# Patient Record
Sex: Female | Born: 1980 | Race: Black or African American | Hispanic: No | Marital: Single | Smoking: Never smoker
Health system: Southern US, Community
[De-identification: ages and names within clinical notes are randomized; demographics above are authoritative.]

## PROBLEM LIST (undated history)

## (undated) ENCOUNTER — Inpatient Hospital Stay (HOSPITAL_COMMUNITY): Payer: Self-pay

## (undated) ENCOUNTER — Emergency Department (HOSPITAL_COMMUNITY): Admission: EM | Discharge status: Absent without leave | Payer: Medicaid Other

## (undated) DIAGNOSIS — F32A Depression, unspecified: Secondary | ICD-10-CM

## (undated) DIAGNOSIS — Z8619 Personal history of other infectious and parasitic diseases: Secondary | ICD-10-CM

## (undated) DIAGNOSIS — F329 Major depressive disorder, single episode, unspecified: Secondary | ICD-10-CM

## (undated) HISTORY — DX: Depression, unspecified: F32.A

## (undated) HISTORY — DX: Major depressive disorder, single episode, unspecified: F32.9

## (undated) HISTORY — DX: Personal history of other infectious and parasitic diseases: Z86.19

---

## 2007-11-19 ENCOUNTER — Inpatient Hospital Stay (HOSPITAL_COMMUNITY): Admission: RE | Admit: 2007-11-19 | Discharge: 2007-11-21 | Payer: Self-pay | Admitting: Obstetrics and Gynecology

## 2008-08-19 ENCOUNTER — Emergency Department (HOSPITAL_COMMUNITY): Admission: EM | Admit: 2008-08-19 | Discharge: 2008-08-19 | Payer: Self-pay | Admitting: Emergency Medicine

## 2008-11-09 ENCOUNTER — Inpatient Hospital Stay (HOSPITAL_COMMUNITY): Admission: AD | Admit: 2008-11-09 | Discharge: 2008-11-09 | Payer: Self-pay | Admitting: Obstetrics and Gynecology

## 2008-11-29 ENCOUNTER — Ambulatory Visit (HOSPITAL_COMMUNITY): Admission: RE | Admit: 2008-11-29 | Discharge: 2008-11-29 | Payer: Self-pay | Admitting: Obstetrics and Gynecology

## 2008-12-19 ENCOUNTER — Inpatient Hospital Stay (HOSPITAL_COMMUNITY): Admission: RE | Admit: 2008-12-19 | Discharge: 2008-12-22 | Payer: Self-pay | Admitting: Obstetrics and Gynecology

## 2008-12-19 ENCOUNTER — Encounter (INDEPENDENT_AMBULATORY_CARE_PROVIDER_SITE_OTHER): Payer: Self-pay | Admitting: Obstetrics and Gynecology

## 2010-03-04 ENCOUNTER — Encounter: Payer: Self-pay | Admitting: Obstetrics and Gynecology

## 2010-05-16 LAB — CBC
HCT: 28.9 % — ABNORMAL LOW (ref 36.0–46.0)
Hemoglobin: 9.4 g/dL — ABNORMAL LOW (ref 12.0–15.0)
MCHC: 32.1 g/dL (ref 30.0–36.0)
MCHC: 32.5 g/dL (ref 30.0–36.0)
MCV: 77.4 fL — ABNORMAL LOW (ref 78.0–100.0)
MCV: 77.8 fL — ABNORMAL LOW (ref 78.0–100.0)
Platelets: 180 10*3/uL (ref 150–400)
RDW: 16.7 % — ABNORMAL HIGH (ref 11.5–15.5)
WBC: 8 10*3/uL (ref 4.0–10.5)

## 2010-05-16 LAB — RPR: RPR Ser Ql: NONREACTIVE

## 2010-05-18 LAB — URINALYSIS, ROUTINE W REFLEX MICROSCOPIC
Hgb urine dipstick: NEGATIVE
Nitrite: NEGATIVE
Protein, ur: NEGATIVE mg/dL
pH: 6.5 (ref 5.0–8.0)

## 2010-05-18 LAB — COMPREHENSIVE METABOLIC PANEL
AST: 17 U/L (ref 0–37)
Albumin: 3.2 g/dL — ABNORMAL LOW (ref 3.5–5.2)
BUN: 4 mg/dL — ABNORMAL LOW (ref 6–23)
CO2: 24 mEq/L (ref 19–32)
Calcium: 8.9 mg/dL (ref 8.4–10.5)
Chloride: 104 mEq/L (ref 96–112)
Creatinine, Ser: 0.7 mg/dL (ref 0.4–1.2)
GFR calc Af Amer: >60 mL/min (ref >60)
Potassium: 3.8 mEq/L (ref 3.5–5.1)
Total Bilirubin: 0.6 mg/dL (ref 0.3–1.2)
Total Protein: 6.7 g/dL (ref 6.0–8.3)

## 2010-05-18 LAB — CBC
Hemoglobin: 10.1 g/dL — ABNORMAL LOW (ref 12.0–15.0)
MCHC: 32.6 g/dL (ref 30.0–36.0)
WBC: 8.3 10*3/uL (ref 4.0–10.5)

## 2010-05-18 LAB — URINE CULTURE: Culture: NO GROWTH

## 2010-05-18 LAB — URINE MICROSCOPIC-ADD ON

## 2010-05-18 LAB — AMYLASE: Amylase: 101 U/L (ref 27–131)

## 2010-05-18 LAB — LIPASE, BLOOD: Lipase: 23 U/L (ref 11–59)

## 2010-06-26 NOTE — Op Note (Signed)
NAMEWERONIKA, BIRCH              ACCOUNT NO.:  [id number]   MEDICAL RECORD NO.:  [id number]          PATIENT TYPE:  INP   LOCATION:  9103                          FACILITY:  [facility]   PHYSICIAN:  Zenaida Niece, M.D.DATE OF BIRTH:  04/24/1980   DATE OF PROCEDURE:  11/19/2007  DATE OF DISCHARGE:                               OPERATIVE REPORT   PREOPERATIVE DIAGNOSES:  1. Intrauterine pregnancy at 39 weeks.  2. Previous cesarean section.   POSTOPERATIVE DIAGNOSES:  1. Intrauterine pregnancy at 39 weeks.  2. Previous cesarean section.   PROCEDURE:  Repeat low-transverse cesarean section.   SURGEON:  Zenaida Niece, MD   ASSISTANT:  Huel Cote, MD   ANESTHESIA:  Spinal.   FINDINGS:  She had normal gravid anatomy with a fairly thin lower  uterine segment.  She delivered a viable female infant with Apgars of 9  and 9 and weight 7 pounds 13 ounces.   SPECIMENS:  Placenta sent for cord blood donation and then delivered in  Delivery.   ESTIMATED BLOOD LOSS:  800 mL.   COMPLICATIONS:  None.   PROCEDURE IN DETAIL:  The patient was taken to the operating room and  placed in the sitting position.  Dr. Malen Gauze instilled spinal anesthesia.  She was then placed in the dorsal supine position with a left lateral  tilt.  Abdomen was then prepped and draped in the usual sterile fashion  and a Foley catheter was inserted.  The level of her anesthesia was  found to be adequate.  Abdomen was entered via a standard Pfannenstiel  incision through her previous scar.  Once the uterus was exposed, the  Alexis disposable self-retaining retractor was placed and the lower  uterine segment was well exposed.  A 4-cm transverse incision was then  made in the lower uterine segment pushing the bladder inferior.  Once  the uterine cavity was entered, the incision was extended digitally.  Membranes were ruptured revealing clear fluid.  The fetal vertex was  grasped and brought to the incision.  It  was then delivered  atraumatically.  Mouth and nares were suctioned and a loose nuchal cord  x1 was reduced.  The remainder of the infant then delivered  atraumatically.  Cord was doubly clamped and cut and the infant was  handed to the awaiting pediatric team.  Placenta was then delivered  about half spontaneously and a half manually and was sent for cord blood  donation.  Uterus was wiped dry with a clean lap pad and all clots and  debris removed.  Uterine incision was inspected and found to be free of  extensions.  Uterine incision was then first repaired in 1 layer with a  running-locking layer with #1 chromic.  The inferior portion of the  lower uterine segment was fairly thin and had several holes in it.  These holes were repaired with interrupted figure-of-eight sutures of #1  chromic with adequate closure and adequate hemostasis.  Tubes and  ovaries were inspected and found to be normal.  Uterine incision was  found to be hemostatic.  The Alexis retractor was  removed.  Subfascial  space was irrigated and made hemostatic with electrocautery.  Fascia was  then closed in a running fashion starting at both ends and meeting in  the middle with 0-Vicryl.  Subcutaneous tissue was then irrigated and  made hemostatic with electrocautery.  Skin was then closed with staples  followed by a sterile dressing.  The patient tolerated the procedure  well and was taken to the recovery room in stable condition.  Counts  were correct x2.  She received Ancef 1 g IV at the beginning of the  procedure and had PAS hose on throughout the procedure.      Zenaida Niece, M.D.  Electronically Signed     TDM/MEDQ  D:  11/19/2007  T:  11/20/2007  Job:  [id number]

## 2010-06-26 NOTE — Discharge Summary (Signed)
Anna James, Anna James              ACCOUNT NO.:  [id number]   MEDICAL RECORD NO.:  [id number]          PATIENT TYPE:  INP   LOCATION:  9103                          [facility]ILITY:  [facility]   PHYSICIAN:  Huel Cote, M.D. DATE OF BIRTH:  09-Aug-1980   DATE OF ADMISSION:  11/19/2007  DATE OF DISCHARGE:  11/21/2007                               DISCHARGE SUMMARY   DISCHARGE DIAGNOSES:  1. Term pregnancy at 39 weeks, delivered.  2. Status post repeat low-transverse cesarean section.   DISCHARGE MEDICATIONS:  1. Motrin 600 mg p.o. every 6 hours.  2. Percocet 1-2 tablets p.o. every 4 hours p.r.n.   Discharge hemoglobin 8.4.   DISCHARGE FOLLOWUP:  The patient is to follow up in the office on Monday  or Tuesday for staple removal and again in 2 weeks for an incision  check.   HOSPITAL COURSE:  The patient is a 30 year old female who underwent a  repeat low-transverse C-section on 11/19/2007.  For her complete history  and physical, please see the previously dictated report.  She underwent  her C-section without difficulty and was delivered of a viable female  infant, Apgars were 9 and 9, weight was 7 pounds 13 ounces.  She had a  normal anatomy noted at the time of C-section except for a thin lower  uterine segment was identified.  Estimated blood loss was 800 mL and she  was admitted for routine postoperative care.  Postop day #1, she was  ambulating and doing quite well.  Her hemoglobin as stated was 8.4,  which was down from just 9.9, and she continued to progress well.  By  postop day #2, she requested early discharge.  She was ambulating  without difficulty, eating, and voiding without problem, and was felt  stable for discharge home.      Huel Cote, M.D.  Electronically Signed     KR/MEDQ  D:  11/21/2007  T:  11/21/2007  Job:  [id number]

## 2010-06-26 NOTE — H&P (Signed)
Anna James, Anna James              ACCOUNT NO.:  [id number]   MEDICAL RECORD NO.:  [id number]          PATIENT TYPE:  INP   LOCATION:  NA                            [facility]ILITY[facility]  [facility]   PHYSICIAN:  Zenaida Niece, M.D.DATE OF BIRTH:  Sep 25, 1980   DATE OF ADMISSION:  DATE OF DISCHARGE:                              HISTORY & PHYSICAL   CHIEF COMPLAINT:  Repeat cesarean section.   HISTORY OF PRESENT ILLNESS:  This is a 30 year old female gravida 2,  para 1-0-0-1 with an EGA of [redacted] weeks by a 19-week ultrasound with a due  date of October 15 who is being admitted for repeat cesarean section.  The patient has had one prior low transverse cesarean section.  However,  she initially decided she wanted a VBAC, but has now decided she wants  to go ahead and proceed with a C-section.  We have attempted to get a  copy and have not been able to get a copy of her operative note from the  cesarean section.  She is being admitted for repeat cesarean section at  this time.  Prenatal care has been otherwise uncomplicated.   PRENATAL LABS:  Blood type is O positive with a negative antibody  screen.  RPR nonreactive, hepatitis B surface antigen negative, rubella  immune, HIV negative, gonorrhea and chlamydia negative, 1-hour Glucola  106.  Group B strep is negative.  Quad screen is normal.   PAST OB HISTORY:  In 2008 she had a low transverse cesarean section for  arrest of dilation.  This was done at 41 weeks.  Baby weighed 9 pounds 8  ounces.  There were no apparent complications.   PAST MEDICAL HISTORY:  Otherwise negative.   PAST SURGICAL HISTORY:  Significant only for the cesarean section.   ALLERGIES:  None known.   CURRENT MEDICATIONS:  None.   REVIEW OF SYSTEMS:  Normal complaints of pregnancy.   FAMILY HISTORY:  Noncontributory.   PHYSICAL EXAM:  CONSTITUTIONAL:  This is a well-developed gravid female  in no acute distress.  VITAL SIGNS:  Weight is 190 pounds, blood pressure is  110/60.  NECK:  Supple without lymphadenopathy or thyromegaly.  The thyroid is  slightly full.  HEART:  Regular rate and rhythm without murmur.  LUNGS:  Clear to auscultation.  ABDOMEN:  Gravid, nontender with a fundal height of 39 cm and well-  healed transverse scar.  EXTREMITIES:  Have trace edema and are nontender.  GENITOURINARY:  Cervix is 1 thick -3 vertex presentation.   ASSESSMENT:  1. Intrauterine pregnancy at 39 weeks.  2. Previous cesarean section.   The patient initially desired a VBAC.  However, she has now changed her  mind and wishes to have a repeat cesarean section.  I have not disagreed  with this since we cannot get a copy of her op note.  All risks of  surgery have been discussed and she understands.   PLAN:  To admit the patient on the day of surgery for repeat cesarean  section.      Zenaida Niece, M.D.  Electronically Signed  TDM/MEDQ  D:  11/18/2007  T:  11/18/2007  Job:  [id number]

## 2010-11-13 LAB — CBC
HCT: 25.5 — ABNORMAL LOW
HCT: 30.6 — ABNORMAL LOW
Hemoglobin: 8.4 — ABNORMAL LOW
Hemoglobin: 9.9 — ABNORMAL LOW
MCHC: 32.3
MCV: 76.6 — ABNORMAL LOW
Platelets: 172
RBC: 3.31 — ABNORMAL LOW
RDW: 16.7 — ABNORMAL HIGH
RDW: 17 — ABNORMAL HIGH
WBC: 8.1

## 2010-11-13 LAB — CCBB MATERNAL DONOR DRAW

## 2010-11-13 LAB — RPR: RPR Ser Ql: NONREACTIVE

## 2014-02-02 ENCOUNTER — Other Ambulatory Visit: Payer: Self-pay | Admitting: Obstetrics and Gynecology

## 2014-02-08 MED ORDER — CEFTRIAXONE SODIUM 250 MG IJ SOLR: 250.0000 mg | Freq: Once | INTRAMUSCULAR | Status: DC

## 2014-02-08 NOTE — Addendum Note (Signed)
Addended by: Rodell PernaHARRIS, Aribella Vavra M on: 02/08/2014 05:09 PM   Modules accepted: Orders

## 2014-09-19 ENCOUNTER — Inpatient Hospital Stay (HOSPITAL_COMMUNITY)
Admission: AD | Admit: 2014-09-19 | Discharge: 2014-09-19 | Disposition: A | Discharge status: Home / Self Care | Payer: Medicaid Other | Source: Ambulatory Visit | Attending: Family Medicine | Admitting: Family Medicine

## 2014-09-19 ENCOUNTER — Encounter (HOSPITAL_COMMUNITY): Payer: Self-pay

## 2014-09-19 ENCOUNTER — Inpatient Hospital Stay (HOSPITAL_COMMUNITY): Payer: Medicaid Other

## 2014-09-19 DIAGNOSIS — O9989 Other specified diseases and conditions complicating pregnancy, childbirth and the puerperium: Secondary | ICD-10-CM | POA: Diagnosis not present

## 2014-09-19 DIAGNOSIS — Z3A01 Less than 8 weeks gestation of pregnancy: Secondary | ICD-10-CM | POA: Diagnosis not present

## 2014-09-19 DIAGNOSIS — R1032 Left lower quadrant pain: Secondary | ICD-10-CM | POA: Diagnosis present

## 2014-09-19 DIAGNOSIS — Z3A08 8 weeks gestation of pregnancy: Secondary | ICD-10-CM | POA: Diagnosis not present

## 2014-09-19 DIAGNOSIS — R109 Unspecified abdominal pain: Secondary | ICD-10-CM | POA: Diagnosis not present

## 2014-09-19 DIAGNOSIS — Z3491 Encounter for supervision of normal pregnancy, unspecified, first trimester: Secondary | ICD-10-CM

## 2014-09-19 DIAGNOSIS — O26899 Other specified pregnancy related conditions, unspecified trimester: Secondary | ICD-10-CM

## 2014-09-19 LAB — CBC
HCT: 37.8 % (ref 36.0–46.0)
HEMOGLOBIN: 12.5 g/dL (ref 12.0–15.0)
MCH: 27.4 pg (ref 26.0–34.0)
MCHC: 33.1 g/dL (ref 30.0–36.0)
MCV: 82.9 fL (ref 78.0–100.0)
Platelets: 203 10*3/uL (ref 150–400)
RBC: 4.56 MIL/uL (ref 3.87–5.11)
RDW: 13.7 % (ref 11.5–15.5)
WBC: 6.9 10*3/uL (ref 4.0–10.5)

## 2014-09-19 LAB — WET PREP, GENITAL
WBC WET PREP: NONE SEEN
Yeast Wet Prep HPF POC: NONE SEEN

## 2014-09-19 LAB — ABO/RH: ABO/RH(D): O POS

## 2014-09-19 LAB — URINALYSIS, ROUTINE W REFLEX MICROSCOPIC
BILIRUBIN URINE: NEGATIVE
GLUCOSE, UA: NEGATIVE mg/dL
Hgb urine dipstick: NEGATIVE
Ketones, ur: NEGATIVE mg/dL
Leukocytes, UA: NEGATIVE
NITRITE: NEGATIVE
Protein, ur: NEGATIVE mg/dL
UROBILINOGEN UA: 0.2 mg/dL (ref 0.0–1.0)
pH: 5.5 (ref 5.0–8.0)

## 2014-09-19 LAB — POCT PREGNANCY, URINE: Preg Test, Ur: POSITIVE — AB

## 2014-09-19 LAB — HCG, QUANTITATIVE, PREGNANCY: HCG, BETA CHAIN, QUANT, S: 14723 m[IU]/mL — AB (ref <5)

## 2014-09-19 NOTE — Discharge Instructions (Signed)

## 2014-09-19 NOTE — MAU Provider Note (Signed)
Chief Complaint: Pregnancy    First Provider Initiated Contact with Patient 09/19/14 1119      SUBJECTIVE HPI: Anna James is a 34 y.o. G4P2003 at [redacted]w[redacted]d by LMP who presents to maternity admissions reporting positive pregnancy test at home, unsure LMP, and cramping abdominal pain, intermittently and on the left side.  She denies vaginal bleeding, vaginal itching/burning, urinary symptoms, h/a, dizziness, n/v, or fever/chills.     Abdominal Pain This is a new problem. The current episode started in the past 7 days. The onset quality is gradual. The problem occurs intermittently. The problem has been waxing and waning. The pain is located in the LLQ. The pain is mild. The quality of the pain is cramping. The abdominal pain does not radiate. Pertinent negatives include no diarrhea, dysuria, fever, frequency, headaches, nausea or vomiting. The pain is aggravated by certain positions and movement. She has tried nothing for the symptoms.    History reviewed. No pertinent past medical history. Past Surgical History  Procedure Laterality Date  . Cesarean section     History   Social History  . Marital Status: Single    Spouse Name: N/A  . Number of Children: N/A  . Years of Education: N/A   Occupational History  . Not on file.   Social History Main Topics  . Smoking status: Never Smoker   . Smokeless tobacco: Not on file  . Alcohol Use: No  . Drug Use: No  . Sexual Activity: Yes    Birth Control/ Protection: None   Other Topics Concern  . Not on file   Social History Narrative  . No narrative on file   No current facility-administered medications on file prior to encounter.   No current outpatient prescriptions on file prior to encounter.   No Known Allergies  Review of Systems  Constitutional: Negative for fever, chills and malaise/fatigue.  Eyes: Negative for blurred vision.  Respiratory: Negative for cough and shortness of breath.   Cardiovascular: Negative for  chest pain.  Gastrointestinal: Positive for abdominal pain. Negative for heartburn, nausea, vomiting and diarrhea.  Genitourinary: Negative for dysuria, urgency and frequency.  Musculoskeletal: Negative.   Neurological: Negative for dizziness and headaches.  Psychiatric/Behavioral: Negative for depression.    OBJECTIVE Blood pressure 115/68, pulse 76, resp. rate 16, last menstrual period 07/20/2014. GENERAL: Well-developed, well-nourished female in no acute distress.  EYES: normal sclera/conjunctiva; no lid-lag HENT: Atraumatic, normocephalic HEART: normal rate RESP: normal effort ABDOMEN: Soft, non-tender MUSCULOSKELETAL: Normal ROM EXTREMITIES: Nontender, no edema NEURO/PSYCH: Alert and oriented, appropriate affect  PELVIC EXAM: Cervix pink, visually closed, without lesion, scant white creamy discharge, vaginal walls and external genitalia normal Bimanual exam: Cervix 0/long/high, firm, anterior, neg CMT, uterus nontender, nonenlarged, adnexa without tenderness, enlargement, or mass   LAB RESULTS Results for orders placed or performed during the hospital encounter of 09/19/14 (from the past 24 hour(s))  Urinalysis, Routine w reflex microscopic (not at [facility])     Status: Abnormal   Collection Time: 09/19/14 10:35 AM  Result Value Ref Range   Color, Urine YELLOW YELLOW   APPearance CLEAR CLEAR   Specific Gravity, Urine >1.030 (H) 1.005 - 1.030   pH 5.5 5.0 - 8.0   Glucose, UA NEGATIVE NEGATIVE mg/dL   Hgb urine dipstick NEGATIVE NEGATIVE   Bilirubin Urine NEGATIVE NEGATIVE   Ketones, ur NEGATIVE NEGATIVE mg/dL   Protein, ur NEGATIVE NEGATIVE mg/dL   Urobilinogen, UA 0.2 0.0 - 1.0 mg/dL   Nitrite NEGATIVE NEGATIVE   Leukocytes,  UA NEGATIVE NEGATIVE  Pregnancy, urine POC     Status: Abnormal   Collection Time: 09/19/14 10:48 AM  Result Value Ref Range   Preg Test, Ur POSITIVE (A) NEGATIVE  Wet prep, genital     Status: Abnormal   Collection Time: 09/19/14 11:04 AM  Result  Value Ref Range   Yeast Wet Prep HPF POC NONE SEEN NONE SEEN   Trich, Wet Prep FEW (A) NONE SEEN   Clue Cells Wet Prep HPF POC FEW (A) NONE SEEN   WBC, Wet Prep HPF POC NONE SEEN NONE SEEN  CBC     Status: None   Collection Time: 09/19/14 11:18 AM  Result Value Ref Range   WBC 6.9 4.0 - 10.5 K/uL   RBC 4.56 3.87 - 5.11 MIL/uL   Hemoglobin 12.5 12.0 - 15.0 g/dL   HCT 40.9 81.1 - 91.4 %   MCV 82.9 78.0 - 100.0 fL   MCH 27.4 26.0 - 34.0 pg   MCHC 33.1 30.0 - 36.0 g/dL   RDW 78.2 95.6 - 21.3 %   Platelets 203 150 - 400 K/uL  hCG, quantitative, pregnancy     Status: Abnormal   Collection Time: 09/19/14 11:18 AM  Result Value Ref Range   hCG, Beta Chain, Quant, S 14723 (H) <5 mIU/mL  ABO/Rh     Status: None (Preliminary result)   Collection Time: 09/19/14 11:18 AM  Result Value Ref Range   ABO/RH(D) O POS     --/--/O POS (08/08 1118)  IMAGING US Ob Comp Less 14 Wks  09/19/2014   CLINICAL DATA:  34 year old pregnant female presenting with left lower quadrant pain. Beta HCG 14,723.  LMP 07/20/2014, EDC by LMP: 04/26/2015, projecting to an expected gestational age of [redacted] weeks 5 days.  EXAM: OBSTETRIC <14 WK [place] AND TRANSVAGINAL OB US  TECHNIQUE: Both transabdominal and transvaginal ultrasound examinations were performed for complete evaluation of the gestation as well as the maternal uterus, adnexal regions, and pelvic cul-de-sac. Transvaginal technique was performed to assess early pregnancy.  COMPARISON:  No prior scans from this gestation.  FINDINGS: Intrauterine gestational sac: Visualized/normal in shape.  Yolk sac:  Present and normal.  Embryo:  Not visualized. No embryonic cardiac activity visualized.  MSD: 12 mm 6 w 0 d [place] EDC (using MSD): 05/15/2015  Maternal uterus/adnexae: No uterine fibroids. No perigestational bleed. Normal cervix. Nonvisualization of the right ovary. No abnormal right adnexal masses detected. The left ovary measures 3.1 by 3.1 cm and contains a thick-walled 3.0 x 2.4  cm cystic structure with no internal vascularity and with heterogeneous internal echoes, in keeping with a slightly involuted hemorrhagic corpus luteum. No additional left adnexal masses are demonstrated. Nonspecific small volume free fluid in the pelvic cul-de-sac and left adnexa.  IMPRESSION: 1. Single intrauterine gestational sac with yolk sac at 6 weeks 0 days by mean sac size, which is discordant with the expected gestational age of [redacted] weeks 5 days by provided menstrual dating. No embryo or embryonic cardiac activity detected. These are worrisome prognostic signs, although these findings are not diagnostic for pregnancy outcome, and the absence of an embryo could be due to early gestational age. As such, close clinical follow-up is advised with serial serum BhCG monitoring and follow-up obstetric scan in 14 days. 2. Slightly involuted hemorrhagic left ovarian corpus luteum with small volume free fluid in the pelvis.   Electronically Signed   By: Delbert Phenix M.D.   On: 09/19/2014 13:04   US Ob Transvaginal  09/19/2014   CLINICAL DATA:  34 year old pregnant female presenting with left lower quadrant pain. Beta HCG 14,723.  LMP 07/20/2014, EDC by LMP: 04/26/2015, projecting to an expected gestational age of [redacted] weeks 5 days.  EXAM: OBSTETRIC <14 WK [place] AND TRANSVAGINAL OB US  TECHNIQUE: Both transabdominal and transvaginal ultrasound examinations were performed for complete evaluation of the gestation as well as the maternal uterus, adnexal regions, and pelvic cul-de-sac. Transvaginal technique was performed to assess early pregnancy.  COMPARISON:  No prior scans from this gestation.  FINDINGS: Intrauterine gestational sac: Visualized/normal in shape.  Yolk sac:  Present and normal.  Embryo:  Not visualized. No embryonic cardiac activity visualized.  MSD: 12 mm 6 w 0 d [place] EDC (using MSD): 05/15/2015  Maternal uterus/adnexae: No uterine fibroids. No perigestational bleed. Normal cervix. Nonvisualization of the right  ovary. No abnormal right adnexal masses detected. The left ovary measures 3.1 by 3.1 cm and contains a thick-walled 3.0 x 2.4 cm cystic structure with no internal vascularity and with heterogeneous internal echoes, in keeping with a slightly involuted hemorrhagic corpus luteum. No additional left adnexal masses are demonstrated. Nonspecific small volume free fluid in the pelvic cul-de-sac and left adnexa.  IMPRESSION: 1. Single intrauterine gestational sac with yolk sac at 6 weeks 0 days by mean sac size, which is discordant with the expected gestational age of [redacted] weeks 5 days by provided menstrual dating. No embryo or embryonic cardiac activity detected. These are worrisome prognostic signs, although these findings are not diagnostic for pregnancy outcome, and the absence of an embryo could be due to early gestational age. As such, close clinical follow-up is advised with serial serum BhCG monitoring and follow-up obstetric scan in 14 days. 2. Slightly involuted hemorrhagic left ovarian corpus luteum with small volume free fluid in the pelvis.   Electronically Signed   By: Delbert Phenix M.D.   On: 09/19/2014 13:04    ASSESSMENT 1. Abdominal pain affecting pregnancy   2. Normal IUP (intrauterine pregnancy) on prenatal ultrasound, first trimester     PLAN Discharge home F/U with prenatal provider for continued evaluation of viability.  Ectopic pregnancy ruled out by visible yolk sac on today's [place].   Follow-up Information    Please follow up.   Why:  Start prenatal care as soon as possible      Follow up with [facility] [place] [facility] ADMISS[facility].   Why:  As needed for emergencies   Contact information:   [address] [id number] mc [place] [address] [phone]      Sharen Counter Certified Nurse-Midwife 09/19/2014  6:17 PM

## 2014-09-19 NOTE — MAU Note (Signed)
Pt notified of lab result and need for ultrasound. Pt verbalizes understanding

## 2014-09-19 NOTE — MAU Note (Signed)
Also feels pain in lower abdomen, moreso on left side that comes and goes.

## 2014-09-19 NOTE — MAU Note (Signed)
Pt states here for verification letter and wants u/s. LMP in beginning of June. No bleeding. Did note vaginal odor yesterday, however denies abnormal discharge.

## 2014-09-20 LAB — GC/CHLAMYDIA PROBE AMP (~~LOC~~) NOT AT ARMC
CHLAMYDIA, DNA PROBE: NEGATIVE
Neisseria Gonorrhea: NEGATIVE

## 2014-09-20 LAB — HIV ANTIBODY (ROUTINE TESTING W REFLEX): HIV Screen 4th Generation wRfx: NONREACTIVE

## 2014-10-11 LAB — OB RESULTS CONSOLE GC/CHLAMYDIA
Chlamydia: NEGATIVE
GC PROBE AMP, GENITAL: NEGATIVE

## 2014-10-11 LAB — OB RESULTS CONSOLE ABO/RH: RH TYPE: POSITIVE

## 2014-10-11 LAB — OB RESULTS CONSOLE RUBELLA ANTIBODY, IGM: Rubella: IMMUNE

## 2014-10-11 LAB — OB RESULTS CONSOLE HIV ANTIBODY (ROUTINE TESTING): HIV: NONREACTIVE

## 2014-10-11 LAB — OB RESULTS CONSOLE ANTIBODY SCREEN: Antibody Screen: NEGATIVE

## 2014-10-11 LAB — OB RESULTS CONSOLE RPR: RPR: NONREACTIVE

## 2014-10-11 LAB — OB RESULTS CONSOLE HEPATITIS B SURFACE ANTIGEN: Hepatitis B Surface Ag: NEGATIVE

## 2015-04-17 LAB — OB RESULTS CONSOLE GBS: GBS: NEGATIVE

## 2015-05-05 ENCOUNTER — Telehealth (HOSPITAL_COMMUNITY): Payer: Self-pay | Admitting: *Deleted

## 2015-05-05 ENCOUNTER — Encounter (HOSPITAL_COMMUNITY): Payer: Self-pay | Admitting: *Deleted

## 2015-05-05 NOTE — Telephone Encounter (Signed)
Preadmission screen  

## 2015-05-10 ENCOUNTER — Encounter (HOSPITAL_COMMUNITY)
Admission: RE | Admit: 2015-05-10 | Discharge: 2015-05-10 | Disposition: A | Discharge status: Home / Self Care | Payer: Medicaid Other | Source: Ambulatory Visit | Attending: Obstetrics and Gynecology | Admitting: Obstetrics and Gynecology

## 2015-05-10 LAB — CBC
HEMATOCRIT: 30.9 % — AB (ref 36.0–46.0)
HEMOGLOBIN: 10.2 g/dL — AB (ref 12.0–15.0)
MCH: 26.4 pg (ref 26.0–34.0)
MCHC: 33 g/dL (ref 30.0–36.0)
MCV: 80.1 fL (ref 78.0–100.0)
PLATELETS: 187 10*3/uL (ref 150–400)
RBC: 3.86 MIL/uL — ABNORMAL LOW (ref 3.87–5.11)
RDW: 15.1 % (ref 11.5–15.5)
WBC: 6.8 10*3/uL (ref 4.0–10.5)

## 2015-05-10 LAB — TYPE AND SCREEN
ABO/RH(D): O POS
Antibody Screen: NEGATIVE

## 2015-05-10 NOTE — H&P (Signed)
Anna James is a 35 y.o. female, G4 P3004, EGA [redacted] weeks with EDC 4-6 presenting for repeat c-section.  Previous c-section x 3, last one for twins, prenatal care uncomplicated.  Maternal Medical History:  Fetal activity: Perceived fetal activity is normal.    Prenatal complications: no prenatal complications   OB History    Gravida Para Term Preterm AB TAB SAB Ectopic Multiple Living   4 3 3      1 4      Past Medical History  Diagnosis Date  . Depression     PP with 2nd, zoloft with 3rd  . Hx of varicella    Past Surgical History  Procedure Laterality Date  . Cesarean section     Family History: family history is not on file. Social History:  reports that she has never smoked. She does not have any smokeless tobacco history on file. She reports that she does not drink alcohol or use illicit drugs.   Prenatal Transfer Tool  Maternal Diabetes: No Genetic Screening: Declined Maternal Ultrasounds/Referrals: Normal Fetal Ultrasounds or other Referrals:  None Maternal Substance Abuse:  No Significant Maternal Medications:  None Significant Maternal Lab Results:  Lab values include: Group B Strep negative Other Comments:  None  Review of Systems  Respiratory: Negative.   Cardiovascular: Negative.       Last menstrual period 07/20/2014. Maternal Exam:  Abdomen: Patient reports no abdominal tenderness. Surgical scars: low transverse.   Estimated fetal weight is 8 lbs.   Fetal presentation: vertex  Introitus: Normal vulva. Normal vagina.    Physical Exam  Constitutional: She appears well-developed and well-nourished.  Neck: Neck supple. No thyromegaly present.  Cardiovascular: Normal rate, regular rhythm and normal heart sounds.   No murmur heard. Respiratory: Effort normal and breath sounds normal. No respiratory distress. She has no wheezes.  GI: Soft.    Prenatal labs: ABO, Rh: O/Positive/-- (08/30 0000) Antibody: Negative (08/30 0000) Rubella:  Immune RPR:  Nonreactive (08/30 0000)  HBsAg: Negative (08/30 0000)  HIV: Non-reactive (08/30 0000)  GBS: Negative (03/06 0000)   Assessment/Plan: IUP at 39 weeks, previous c-section x3, for repeat c-section.  Procedure and risks have been discussed, will admit for repeat c-section.   Ludella Pranger D 05/10/2015, 3:58 PM

## 2015-05-10 NOTE — Anesthesia Preprocedure Evaluation (Signed)
Anesthesia Evaluation  Patient identified by MRN, date of birth, ID band Patient awake    Reviewed: Allergy & Precautions, NPO status , Patient's Chart, lab work & pertinent test results  History of Anesthesia Complications Negative for: history of anesthetic complications  Airway Mallampati: II  TM Distance: >3 FB Neck ROM: Full    Dental no notable dental hx. (+) Dental Advisory Given   Pulmonary neg pulmonary ROS,    Pulmonary exam normal breath sounds clear to auscultation       Cardiovascular negative cardio ROS Normal cardiovascular exam Rhythm:Regular Rate:Normal     Neuro/Psych PSYCHIATRIC DISORDERS Anxiety Depression negative neurological ROS     GI/Hepatic negative GI ROS, Neg liver ROS,   Endo/Other  obesity  Renal/GU negative Renal ROS  negative genitourinary   Musculoskeletal negative musculoskeletal ROS (+)   Abdominal   Peds negative pediatric ROS (+)  Hematology negative hematology ROS (+)   Anesthesia Other Findings   Reproductive/Obstetrics (+) Pregnancy                             Anesthesia Physical Anesthesia Plan  ASA: II  Anesthesia Plan: Spinal   Post-op Pain Management:    Induction:   Airway Management Planned:   Additional Equipment:   Intra-op Plan:   Post-operative Plan:   Informed Consent: I have reviewed the patients History and Physical, chart, labs and discussed the procedure including the risks, benefits and alternatives for the proposed anesthesia with the patient or authorized representative who has indicated his/her understanding and acceptance.   Dental advisory given  Plan Discussed with: CRNA  Anesthesia Plan Comments:         Anesthesia Quick Evaluation

## 2015-05-11 ENCOUNTER — Inpatient Hospital Stay (HOSPITAL_COMMUNITY): Payer: Medicaid Other | Admitting: Anesthesiology

## 2015-05-11 ENCOUNTER — Encounter (HOSPITAL_COMMUNITY): Payer: Self-pay | Admitting: Emergency Medicine

## 2015-05-11 ENCOUNTER — Encounter (HOSPITAL_COMMUNITY)
Admission: RE | Disposition: A | Discharge status: Home / Self Care | Payer: Self-pay | Source: Ambulatory Visit | Attending: Obstetrics and Gynecology

## 2015-05-11 ENCOUNTER — Inpatient Hospital Stay (HOSPITAL_COMMUNITY)
Admission: RE | Admit: 2015-05-11 | Discharge: 2015-05-14 | DRG: 766 | Disposition: A | Discharge status: Home / Self Care | Payer: Medicaid Other | Source: Ambulatory Visit | Attending: Obstetrics and Gynecology | Admitting: Obstetrics and Gynecology

## 2015-05-11 DIAGNOSIS — O34211 Maternal care for low transverse scar from previous cesarean delivery: Principal | ICD-10-CM | POA: Diagnosis present

## 2015-05-11 DIAGNOSIS — O99344 Other mental disorders complicating childbirth: Secondary | ICD-10-CM | POA: Diagnosis not present

## 2015-05-11 DIAGNOSIS — Z98891 History of uterine scar from previous surgery: Secondary | ICD-10-CM

## 2015-05-11 DIAGNOSIS — F329 Major depressive disorder, single episode, unspecified: Secondary | ICD-10-CM | POA: Diagnosis not present

## 2015-05-11 DIAGNOSIS — Z3A39 39 weeks gestation of pregnancy: Secondary | ICD-10-CM

## 2015-05-11 LAB — RPR: RPR Ser Ql: NONREACTIVE

## 2015-05-11 SURGERY — Surgical Case
Anesthesia: Spinal

## 2015-05-11 MED ORDER — KETOROLAC TROMETHAMINE 30 MG/ML IJ SOLN
INTRAMUSCULAR | Status: AC
  Filled 2015-05-11: qty 1

## 2015-05-11 MED ORDER — ONDANSETRON HCL 4 MG/2ML IJ SOLN
INTRAMUSCULAR | Status: DC | PRN
  Administered 2015-05-11: 4 mg via INTRAVENOUS

## 2015-05-11 MED ORDER — NALBUPHINE HCL 10 MG/ML IJ SOLN: 5.0000 mg | Freq: Once | INTRAMUSCULAR | Status: AC | PRN

## 2015-05-11 MED ORDER — FENTANYL CITRATE (PF) 100 MCG/2ML IJ SOLN
INTRAMUSCULAR | Status: AC
  Filled 2015-05-11: qty 2

## 2015-05-11 MED ORDER — PHENYLEPHRINE 8 MG IN D5W 100 ML (0.08MG/ML) PREMIX OPTIME
INJECTION | INTRAVENOUS | Status: DC | PRN
  Administered 2015-05-11: 60 ug/min via INTRAVENOUS

## 2015-05-11 MED ORDER — DIPHENHYDRAMINE HCL 25 MG PO CAPS
25.0000 mg | ORAL_CAPSULE | ORAL | Status: DC | PRN
  Administered 2015-05-11: 25 mg via ORAL
  Filled 2015-05-11: qty 1

## 2015-05-11 MED ORDER — SENNOSIDES-DOCUSATE SODIUM 8.6-50 MG PO TABS
2.0000 | ORAL_TABLET | ORAL | Status: DC
  Administered 2015-05-11 – 2015-05-13 (×3): 2 via ORAL
  Filled 2015-05-11 (×3): qty 2

## 2015-05-11 MED ORDER — NALOXONE HCL 2 MG/2ML IJ SOSY
1.0000 ug/kg/h | PREFILLED_SYRINGE | INTRAVENOUS | Status: DC | PRN
  Filled 2015-05-11: qty 2

## 2015-05-11 MED ORDER — MENTHOL 3 MG MT LOZG: 1.0000 | LOZENGE | OROMUCOSAL | Status: DC | PRN

## 2015-05-11 MED ORDER — NALBUPHINE HCL 10 MG/ML IJ SOLN
INTRAMUSCULAR | Status: AC
  Administered 2015-05-11: 5 mg via INTRAVENOUS
  Filled 2015-05-11: qty 1

## 2015-05-11 MED ORDER — NALBUPHINE HCL 10 MG/ML IJ SOLN
5.0000 mg | INTRAMUSCULAR | Status: DC | PRN
  Administered 2015-05-11: 5 mg via INTRAVENOUS
  Filled 2015-05-11: qty 1

## 2015-05-11 MED ORDER — OXYTOCIN 10 UNIT/ML IJ SOLN
40.0000 [IU] | INTRAMUSCULAR | Status: DC | PRN
  Administered 2015-05-11: 40 [IU] via INTRAVENOUS

## 2015-05-11 MED ORDER — PHENYLEPHRINE 8 MG IN D5W 100 ML (0.08MG/ML) PREMIX OPTIME
INJECTION | INTRAVENOUS | Status: AC
Start: 2015-05-11 — End: 2015-05-11
  Filled 2015-05-11: qty 100

## 2015-05-11 MED ORDER — MORPHINE SULFATE (PF) 0.5 MG/ML IJ SOLN
INTRAMUSCULAR | Status: DC | PRN
  Administered 2015-05-11: .2 mg via INTRATHECAL

## 2015-05-11 MED ORDER — DIPHENHYDRAMINE HCL 50 MG/ML IJ SOLN: 12.5000 mg | INTRAMUSCULAR | Status: DC | PRN

## 2015-05-11 MED ORDER — FENTANYL CITRATE (PF) 100 MCG/2ML IJ SOLN
INTRAMUSCULAR | Status: DC | PRN
  Administered 2015-05-11: 10 ug via INTRATHECAL
  Administered 2015-05-11: 90 ug via INTRAVENOUS

## 2015-05-11 MED ORDER — DIPHENHYDRAMINE HCL 25 MG PO CAPS
25.0000 mg | ORAL_CAPSULE | Freq: Four times a day (QID) | ORAL | Status: DC | PRN
  Filled 2015-05-11: qty 1

## 2015-05-11 MED ORDER — OXYCODONE HCL 5 MG PO TABS
5.0000 mg | ORAL_TABLET | ORAL | Status: DC | PRN
  Administered 2015-05-12: 5 mg via ORAL
  Filled 2015-05-11 (×2): qty 1

## 2015-05-11 MED ORDER — LANOLIN HYDROUS EX OINT: 1.0000 "application " | TOPICAL_OINTMENT | CUTANEOUS | Status: DC | PRN

## 2015-05-11 MED ORDER — MEPERIDINE HCL 25 MG/ML IJ SOLN: 6.2500 mg | INTRAMUSCULAR | Status: DC | PRN

## 2015-05-11 MED ORDER — PRENATAL MULTIVITAMIN CH
1.0000 | ORAL_TABLET | Freq: Every day | ORAL | Status: DC
  Administered 2015-05-12 – 2015-05-14 (×3): 1 via ORAL
  Filled 2015-05-11 (×3): qty 1

## 2015-05-11 MED ORDER — SCOPOLAMINE 1 MG/3DAYS TD PT72: 1.0000 | MEDICATED_PATCH | Freq: Once | TRANSDERMAL | Status: DC

## 2015-05-11 MED ORDER — BUPIVACAINE IN DEXTROSE 0.75-8.25 % IT SOLN
INTRATHECAL | Status: DC | PRN
  Administered 2015-05-11: 1.8 mL via INTRATHECAL

## 2015-05-11 MED ORDER — SCOPOLAMINE 1 MG/3DAYS TD PT72
MEDICATED_PATCH | TRANSDERMAL | Status: AC
  Administered 2015-05-11: 1.5 mg via TRANSDERMAL
  Filled 2015-05-11: qty 1

## 2015-05-11 MED ORDER — MAGNESIUM HYDROXIDE 400 MG/5ML PO SUSP: 30.0000 mL | ORAL | Status: DC | PRN

## 2015-05-11 MED ORDER — CEFAZOLIN SODIUM-DEXTROSE 2-4 GM/100ML-% IV SOLN
2.0000 g | INTRAVENOUS | Status: AC
  Administered 2015-05-11: 2 g via INTRAVENOUS
  Filled 2015-05-11: qty 100

## 2015-05-11 MED ORDER — KETOROLAC TROMETHAMINE 30 MG/ML IJ SOLN
30.0000 mg | Freq: Four times a day (QID) | INTRAMUSCULAR | Status: AC | PRN
  Administered 2015-05-11: 30 mg via INTRAMUSCULAR

## 2015-05-11 MED ORDER — ONDANSETRON HCL 4 MG/2ML IJ SOLN: 4.0000 mg | Freq: Once | INTRAMUSCULAR | Status: DC | PRN

## 2015-05-11 MED ORDER — SIMETHICONE 80 MG PO CHEW
80.0000 mg | CHEWABLE_TABLET | ORAL | Status: DC | PRN
  Administered 2015-05-12 – 2015-05-13 (×4): 80 mg via ORAL
  Filled 2015-05-11 (×4): qty 1

## 2015-05-11 MED ORDER — NALOXONE HCL 0.4 MG/ML IJ SOLN: 0.4000 mg | INTRAMUSCULAR | Status: DC | PRN

## 2015-05-11 MED ORDER — SODIUM CHLORIDE 0.9% FLUSH: 3.0000 mL | INTRAVENOUS | Status: DC | PRN

## 2015-05-11 MED ORDER — OXYCODONE HCL 5 MG PO TABS
10.0000 mg | ORAL_TABLET | ORAL | Status: DC | PRN
  Administered 2015-05-13 – 2015-05-14 (×9): 10 mg via ORAL
  Filled 2015-05-11 (×9): qty 2

## 2015-05-11 MED ORDER — TETANUS-DIPHTH-ACELL PERTUSSIS 5-2.5-18.5 LF-MCG/0.5 IM SUSP: 0.5000 mL | Freq: Once | INTRAMUSCULAR | Status: DC

## 2015-05-11 MED ORDER — ACETAMINOPHEN 500 MG PO TABS
1000.0000 mg | ORAL_TABLET | Freq: Four times a day (QID) | ORAL | Status: AC
  Administered 2015-05-12: 1000 mg via ORAL
  Filled 2015-05-11 (×2): qty 2

## 2015-05-11 MED ORDER — IBUPROFEN 600 MG PO TABS
600.0000 mg | ORAL_TABLET | Freq: Four times a day (QID) | ORAL | Status: DC
  Administered 2015-05-11 – 2015-05-14 (×13): 600 mg via ORAL
  Filled 2015-05-11 (×13): qty 1

## 2015-05-11 MED ORDER — ONDANSETRON HCL 4 MG/2ML IJ SOLN
INTRAMUSCULAR | Status: AC
Start: 2015-05-11 — End: 2015-05-11
  Filled 2015-05-11: qty 2

## 2015-05-11 MED ORDER — DIBUCAINE 1 % RE OINT
1.0000 | TOPICAL_OINTMENT | RECTAL | Status: DC | PRN
Start: 2015-05-11 — End: 2015-05-15

## 2015-05-11 MED ORDER — OXYTOCIN 10 UNIT/ML IJ SOLN: 2.5000 [IU]/h | INTRAVENOUS | Status: AC

## 2015-05-11 MED ORDER — LACTATED RINGERS IV SOLN
INTRAVENOUS | Status: DC
  Administered 2015-05-11: 16:00:00 via INTRAVENOUS

## 2015-05-11 MED ORDER — SODIUM CHLORIDE 0.9 % IR SOLN
Status: DC | PRN
  Administered 2015-05-11: 1000 mL

## 2015-05-11 MED ORDER — MEASLES, MUMPS & RUBELLA VAC ~~LOC~~ INJ: 0.5000 mL | INJECTION | Freq: Once | SUBCUTANEOUS | Status: DC

## 2015-05-11 MED ORDER — ZOLPIDEM TARTRATE 5 MG PO TABS: 5.0000 mg | ORAL_TABLET | Freq: Every evening | ORAL | Status: DC | PRN

## 2015-05-11 MED ORDER — ACETAMINOPHEN 325 MG PO TABS: 650.0000 mg | ORAL_TABLET | ORAL | Status: DC | PRN

## 2015-05-11 MED ORDER — SCOPOLAMINE 1 MG/3DAYS TD PT72
1.0000 | MEDICATED_PATCH | Freq: Once | TRANSDERMAL | Status: DC
  Administered 2015-05-11: 1.5 mg via TRANSDERMAL

## 2015-05-11 MED ORDER — CEFAZOLIN SODIUM-DEXTROSE 2-3 GM-% IV SOLR
INTRAVENOUS | Status: AC
Start: 2015-05-11 — End: 2015-05-11
  Filled 2015-05-11: qty 50

## 2015-05-11 MED ORDER — NALBUPHINE HCL 10 MG/ML IJ SOLN
5.0000 mg | Freq: Once | INTRAMUSCULAR | Status: AC | PRN
  Administered 2015-05-11: 5 mg via SUBCUTANEOUS

## 2015-05-11 MED ORDER — FENTANYL CITRATE (PF) 100 MCG/2ML IJ SOLN
25.0000 ug | INTRAMUSCULAR | Status: DC | PRN
  Administered 2015-05-11 (×2): 50 ug via INTRAVENOUS

## 2015-05-11 MED ORDER — MORPHINE SULFATE (PF) 0.5 MG/ML IJ SOLN
INTRAMUSCULAR | Status: AC
Start: 2015-05-11 — End: 2015-05-11
  Filled 2015-05-11: qty 10

## 2015-05-11 MED ORDER — LACTATED RINGERS IV SOLN
INTRAVENOUS | Status: DC
Start: 2015-05-11 — End: 2015-05-11
  Administered 2015-05-11: 07:00:00 via INTRAVENOUS

## 2015-05-11 MED ORDER — OXYTOCIN 10 UNIT/ML IJ SOLN
INTRAMUSCULAR | Status: AC
  Filled 2015-05-11: qty 4

## 2015-05-11 MED ORDER — ONDANSETRON HCL 4 MG/2ML IJ SOLN
4.0000 mg | Freq: Three times a day (TID) | INTRAMUSCULAR | Status: DC | PRN
Start: 2015-05-11 — End: 2015-05-15

## 2015-05-11 MED ORDER — NALBUPHINE HCL 10 MG/ML IJ SOLN
5.0000 mg | INTRAMUSCULAR | Status: DC | PRN
  Administered 2015-05-11: 5 mg via SUBCUTANEOUS
  Filled 2015-05-11: qty 1

## 2015-05-11 MED ORDER — WITCH HAZEL-GLYCERIN EX PADS
1.0000 | MEDICATED_PAD | CUTANEOUS | Status: DC | PRN
Start: 2015-05-11 — End: 2015-05-15

## 2015-05-11 MED ORDER — KETOROLAC TROMETHAMINE 30 MG/ML IJ SOLN: 30.0000 mg | Freq: Four times a day (QID) | INTRAMUSCULAR | Status: AC | PRN

## 2015-05-11 SURGICAL SUPPLY — 35 items
BENZOIN TINCTURE PRP APPL 2/3 (GAUZE/BANDAGES/DRESSINGS) ×3 IMPLANT
CHLORAPREP W/TINT 26ML (MISCELLANEOUS) ×3 IMPLANT
CLAMP CORD UMBIL (MISCELLANEOUS) IMPLANT
CLOSURE WOUND 1/2 X4 (GAUZE/BANDAGES/DRESSINGS) ×1
CLOTH BEACON ORANGE TIMEOUT ST (SAFETY) ×3 IMPLANT
CONTAINER PREFILL 10% NBF 15ML (MISCELLANEOUS) IMPLANT
DRSG OPSITE POSTOP 4X10 (GAUZE/BANDAGES/DRESSINGS) ×3 IMPLANT
ELECT REM PT RETURN 9FT ADLT (ELECTROSURGICAL) ×3
ELECTRODE REM PT RTRN 9FT ADLT (ELECTROSURGICAL) ×1 IMPLANT
EXTRACTOR VACUUM KIWI (MISCELLANEOUS) IMPLANT
EXTRACTOR VACUUM M CUP 4 TUBE (SUCTIONS) IMPLANT
EXTRACTOR VACUUM M CUP 4' TUBE (SUCTIONS)
GLOVE BIOGEL PI IND STRL 7.0 (GLOVE) ×1 IMPLANT
GLOVE BIOGEL PI INDICATOR 7.0 (GLOVE) ×2
GLOVE ORTHO TXT STRL SZ7.5 (GLOVE) ×3 IMPLANT
GOWN STRL REUS W/TWL LRG LVL3 (GOWN DISPOSABLE) ×9 IMPLANT
KIT ABG SYR 3ML LUER SLIP (SYRINGE) IMPLANT
NEEDLE HYPO 25X5/8 SAFETYGLIDE (NEEDLE) ×3 IMPLANT
NS IRRIG 1000ML POUR BTL (IV SOLUTION) ×3 IMPLANT
PACK C SECTION WH (CUSTOM PROCEDURE TRAY) ×3 IMPLANT
PAD OB MATERNITY 4.3X12.25 (PERSONAL CARE ITEMS) ×3 IMPLANT
PENCIL SMOKE EVAC W/HOLSTER (ELECTROSURGICAL) ×3 IMPLANT
RTRCTR C-SECT PINK 25CM LRG (MISCELLANEOUS) ×3 IMPLANT
STRIP CLOSURE SKIN 1/2X4 (GAUZE/BANDAGES/DRESSINGS) ×2 IMPLANT
SUT CHROMIC 1 CTX 36 (SUTURE) ×6 IMPLANT
SUT PLAIN 0 NONE (SUTURE) IMPLANT
SUT PLAIN 2 0 XLH (SUTURE) IMPLANT
SUT VIC AB 0 CT1 27 (SUTURE) ×4
SUT VIC AB 0 CT1 27XBRD ANBCTR (SUTURE) ×2 IMPLANT
SUT VIC AB 2-0 CT1 (SUTURE) ×3 IMPLANT
SUT VIC AB 2-0 CT1 27 (SUTURE) ×2
SUT VIC AB 2-0 CT1 TAPERPNT 27 (SUTURE) ×1 IMPLANT
SUT VIC AB 4-0 KS 27 (SUTURE) ×3 IMPLANT
TOWEL OR 17X24 6PK STRL BLUE (TOWEL DISPOSABLE) ×3 IMPLANT
TRAY FOLEY CATH SILVER 14FR (SET/KITS/TRAYS/PACK) ×3 IMPLANT

## 2015-05-11 NOTE — Anesthesia Procedure Notes (Signed)
Spinal Patient location during procedure: OR Staffing Anesthesiologist: Alyssa Rotondo Performed by: anesthesiologist  Preanesthetic Checklist Completed: patient identified, site marked, surgical consent, pre-op evaluation, timeout performed, IV checked, risks and benefits discussed and monitors and equipment checked Spinal Block Patient position: sitting Prep: DuraPrep Patient monitoring: continuous pulse ox, blood pressure and heart rate Approach: midline Location: L3-4 Injection technique: single-shot Needle Needle type: Sprotte  Needle gauge: 24 G Needle length: 9 cm Additional Notes Functioning IV was confirmed and monitors were applied. Sterile prep and drape, including hand hygiene, mask and sterile gloves were used. The patient was positioned and the spine was prepped. The skin was anesthetized with lidocaine.  Free flow of clear CSF was obtained prior to injecting local anesthetic into the CSF.  The spinal needle aspirated freely following injection.  The needle was carefully withdrawn.  The patient tolerated the procedure well. Consent was obtained prior to procedure with all questions answered and concerns addressed. Risks including but not limited to bleeding, infection, nerve damage, paralysis, failed block, inadequate analgesia, allergic reaction, high spinal, itching and headache were discussed and the patient wished to proceed.   Sha Amer, MD     

## 2015-05-11 NOTE — Anesthesia Postprocedure Evaluation (Signed)
Anesthesia Post Note  Patient: Leland JohnsShameela A Arkwright  Procedure(s) Performed: Procedure(s) (LRB): CESAREAN SECTION (N/A)  Patient location during evaluation: Mother Baby Anesthesia Type: Spinal Level of consciousness: awake and alert and oriented Pain management: satisfactory to patient Vital Signs Assessment: post-procedure vital signs reviewed and stable Respiratory status: spontaneous breathing and nonlabored ventilation Cardiovascular status: stable Postop Assessment: no headache, no backache, patient able to bend at knees, no signs of nausea or vomiting and adequate PO intake Anesthetic complications: no    Last Vitals:  Filed Vitals:   05/11/15 1435 05/11/15 1440  BP: 109/64 106/61  Pulse: 67 79  Temp:    Resp:      Last Pain:  Filed Vitals:   05/11/15 1608  PainSc: 4                  Nely Dedmon

## 2015-05-11 NOTE — Interval H&P Note (Signed)
History and Physical Interval Note:  05/11/2015 7:09 AM  Anna James  has presented today for surgery, with the diagnosis of Repeat C/Section  The various methods of treatment have been discussed with the patient and family. After consideration of risks, benefits and other options for treatment, the patient has consented to  Procedure(s): CESAREAN SECTION (N/A) as a surgical intervention .  The patient's history has been reviewed, patient examined, no change in status, stable for surgery.  I have reviewed the patient's chart and labs.  Questions were answered to the patient's satisfaction.     [name]

## 2015-05-11 NOTE — Anesthesia Postprocedure Evaluation (Signed)
Anesthesia Post Note  Patient: Anna James  Procedure(s) Performed: Procedure(s) (LRB): CESAREAN SECTION (N/A)  Patient location during evaluation: PACU Anesthesia Type: Spinal Level of consciousness: awake and alert Pain management: pain level controlled Vital Signs Assessment: post-procedure vital signs reviewed and stable Respiratory status: spontaneous breathing, nonlabored ventilation, respiratory function stable and patient connected to nasal cannula oxygen Cardiovascular status: blood pressure returned to baseline and stable Postop Assessment: no signs of nausea or vomiting, spinal receding and patient able to bend at knees Anesthetic complications: no    Last Vitals:  Filed Vitals:   05/11/15 1010 05/11/15 1100  BP:  90/47  Pulse: 52 87  Temp:    Resp: 21 20    Last Pain:  Filed Vitals:   05/11/15 1144  PainSc: Asleep                 Eleonora Peeler JENNETTE

## 2015-05-11 NOTE — Op Note (Signed)
Preoperative diagnosis: Intrauterine pregnancy at 39 weeks, previous c-section x 3 Postoperative diagnosis: Same Procedure: Repeat  low transverse cesarean section without extensions Surgeon: Lavina Hammanodd Shakerra Red M.D. Assistant:  Darlina SicilianHeather Kreitmeyer, RNFA Anesthesia: Spinal  Findings: Patient had normal gravid anatomy and delivered a viable female infant with Apgars of 9 and 9 weight pending Estimated blood loss: 800 cc Specimens: Placenta sent to labor and delivery Complications: None  Procedure in detail: The patient was taken to the operating room and placed in the sitting position. Dr. Gentry RochJudd instilled spinal anesthesia.  She was then placed in the dorsosupine position with left tilt. Abdomen was then prepped and draped in the usual sterile fashion, and a foley catheter was inserted. The level of her anesthesia was found to be adequate. Abdomen was entered via a standard Pfannenstiel incision. Through her previous scar, there were minimal adhesions. Once the peritoneal cavity was entered the Alexis disposable self-retaining retractor was placed and good visualization was achieved. A 4 cm transverse incision was then made in the lower uterine segment pushing the bladder inferior. Once the uterine cavity was entered the incision was extended digitally. The fetal vertex was grasped and delivered through the incision atraumatically. Mouth and nares were suctioned. The remainder of the infant then delivered atraumatically. Cord was doubly clamped and cut and the infant handed to the awaiting pediatric team. Cord blood was obtained. The placenta delivered spontaneously. Uterus was wiped dry with clean lap pad and all clots and debris were removed. Uterine incision was inspected and found to be free of extensions. Uterine incision was closed in 1 layer with running locking #1 Chromic. Tubes and ovaries were inspected and found to be normal. Uterine incision was inspected and found to be hemostatic. Bleeding from  serosal edges was controlled with electrocautery. The Alexis retractor was removed. Subfascial space was irrigated and made hemostatic with electrocautery. Peritoneum was closed with 2-0 Vicryl.  Fascia was closed in running fashion starting at both ends and meeting in the middle with 0 Vicryl. Subcutaneous tissue was then irrigated and made hemostatic with electrocautery. Skin was closed with running 4-0 Vicryl subcuticular suture followed by steri-strips and a sterile dressing. Patient tolerated the procedure well and was taken to the recovery in stable condition. Counts were correct x2, she received Ancef 2 g IV at the beginning of the procedure and she had PAS hose on throughout the procedure.

## 2015-05-11 NOTE — Progress Notes (Signed)
UR chart review completed.  

## 2015-05-11 NOTE — Addendum Note (Signed)
Addendum  created 05/11/15 1622 by Shanon PayorSuzanne M Kennedy Bohanon, CRNA   Modules edited: Clinical Notes   Clinical Notes:  File: [id number]

## 2015-05-11 NOTE — Transfer of Care (Signed)
Immediate Anesthesia Transfer of Care Note  Patient: Anna JohnsShameela A Pavlich  Procedure(s) Performed: Procedure(s): CESAREAN SECTION (N/A)  Patient Location: PACU  Anesthesia Type:Spinal  Level of Consciousness: awake, alert  and oriented  Airway & Oxygen Therapy: Patient Spontanous Breathing  Post-op Assessment: Report given to RN and Post -op Vital signs reviewed and stable  Post vital signs: Reviewed and stable  Last Vitals:  Filed Vitals:   05/11/15 1009 05/11/15 1010  BP:    Pulse: 77 52  Temp:    Resp: 18 21    Complications: No apparent anesthesia complications

## 2015-05-11 NOTE — Lactation Note (Signed)
This note was copied from a baby's chart. Lactation Consultation Note  Patient Name: Anna James ZOXWR'UToday's Date: 05/11/2015 Reason for consult: Initial assessment Baby at 9 hr of life and mom is worried about supply so she started formula. She stated with her 37 yr old she never made milk, with her 37 yr old her nipples had blisters, and she did not even try to bf the 37 yr old twins. Discussed baby behavior, feeding frequency, baby belly size, voids, wt loss, breast changes, and nipple care. Answered questions about pumping. She stated that she tried to manually express but did not see anything. Offered to help but she declined. Mom will offer both breast at least 10 minutes each on demand 8+/24 hr and only offer formula after bf per the guidelines if the baby still seems hungry. Given lactation handouts. Aware of OP services and support group.    Maternal Data Has patient been taught Hand Expression?: Yes Does the patient have breastfeeding experience prior to this delivery?: Yes  Feeding Feeding Type: Breast Fed Length of feed: 20 min  LATCH Score/Interventions Latch: Repeated attempts needed to sustain latch, nipple held in mouth throughout feeding, stimulation needed to elicit sucking reflex. Intervention(s): Adjust position;Assist with latch;Breast massage;Breast compression  Audible Swallowing: None Intervention(s): Skin to skin  Type of Nipple: Inverted Intervention(s): Reverse pressure Intervention(s): Reverse pressure  Comfort (Breast/Nipple): Soft / non-tender     Hold (Positioning): Assistance needed to correctly position infant at breast and maintain latch. Intervention(s): Breastfeeding basics reviewed;Support Pillows;Position options;Skin to skin  LATCH Score: 4  Lactation Tools Discussed/Used WIC Program: Yes   Consult Status Consult Status: Follow-up Date: 05/12/15 Follow-up type: In-patient    Rulon Eisenmengerlizabeth E Jenene Kauffmann 05/11/2015, 5:00 PM

## 2015-05-12 ENCOUNTER — Encounter (HOSPITAL_COMMUNITY): Payer: Self-pay | Admitting: Obstetrics and Gynecology

## 2015-05-12 LAB — CBC
HEMATOCRIT: 25.8 % — AB (ref 36.0–46.0)
Hemoglobin: 8.6 g/dL — ABNORMAL LOW (ref 12.0–15.0)
MCH: 26.6 pg (ref 26.0–34.0)
MCHC: 33.3 g/dL (ref 30.0–36.0)
MCV: 79.9 fL (ref 78.0–100.0)
Platelets: 158 10*3/uL (ref 150–400)
RBC: 3.23 MIL/uL — AB (ref 3.87–5.11)
RDW: 15 % (ref 11.5–15.5)
WBC: 7.4 10*3/uL (ref 4.0–10.5)

## 2015-05-12 LAB — BIRTH TISSUE RECOVERY COLLECTION (PLACENTA DONATION)

## 2015-05-12 NOTE — Progress Notes (Signed)
CSW made report to Child Protective Services based on information that MOB's 4 other children are in Foster Care.  CSW has not had an opportunity to meet with MOB at this point, but felt since it is late in the day on Friday, that CPS needed to be notified of baby's birth even prior to CSW's assessment.  CPS case was accepted with an immediate response time and assigned to L. McGee.  CSW expects this means a CPS worker will meet with MOB in the hospital today.  CSW notified Central Nursery Charge RN.  CSW will follow up tomorrow. 

## 2015-05-12 NOTE — Lactation Note (Signed)
This note was copied from a baby's chart. Lactation Consultation Note  Patient Name: Girl Bonnita NasutiShameela Duesing ZOXWR'UToday's Date: 05/12/2015 Reason for consult: Follow-up assessment   With this mom of a term baby, now 7631 hours old. Mom started breastfeeding, and now has been bottle/formula feeding. Mom reports baby prefers the breast. Mom allowed me to show her with hand expression that she has a good amount of colostrum, and I suggested that she go back to breast feeding, when the baby next shows cues. The mom wanted to start DEP, so I set up pump, mom began pumping, and had transitional milk flowing from left breast. The baby woke up soon after mom began pumping, so mom stopped pumping and the baby latched well to left breast, in football hold. I encouraged mom to continue breast feeding, told her she does not need to pump if baby is breastfeeding. Mom knows to call for questions/concerns.   Maternal Data    Feeding Feeding Type: Breast Fed  LATCH Score/Interventions Latch: Repeated attempts needed to sustain latch, nipple held in mouth throughout feeding, stimulation needed to elicit sucking reflex. Intervention(s): Adjust position;Assist with latch;Breast compression  Audible Swallowing: A few with stimulation  Type of Nipple: Everted at rest and after stimulation  Comfort (Breast/Nipple): Soft / non-tender     Hold (Positioning): Assistance needed to correctly position infant at breast and maintain latch. Intervention(s): Breastfeeding basics reviewed;Support Pillows;Position options;Skin to skin  LATCH Score: 7  Lactation Tools Discussed/Used Pump Review: Setup, frequency, and cleaning;Milk Storage;Other (comment) (hand expression  advised after pumping) Initiated by:: c Lacinda Curvin rn IBCLC Date initiated:: 05/13/15   Consult Status Consult Status: Follow-up Date: 05/13/15 Follow-up type: In-patient    Alfred LevinsLee, Dilyn Osoria Anne 05/12/2015, 3:28 PM

## 2015-05-12 NOTE — Progress Notes (Signed)
Subjective: Postpartum Day #1: Cesarean Delivery Patient reports incisional pain, tolerating PO and no problems voiding.    Objective: Vital signs in last 24 hours: Temp:  [97.7 F (36.5 C)-98.8 F (37.1 C)] 97.7 F (36.5 C) (03/31 0800) Pulse Rate:  [52-116] 64 (03/31 0800) Resp:  [12-35] 16 (03/31 0800) BP: (90-126)/(47-84) 99/74 mmHg (03/31 0800) SpO2:  [88 %-100 %] 100 % (03/31 0800)  Physical Exam:  General: alert Lochia: appropriate Uterine Fundus: firm Incision: dressing C/D/I    Recent Labs  05/10/15 1630 05/12/15 0610  HGB 10.2* 8.6*  HCT 30.9* 25.8*    Assessment/Plan: Status post Cesarean section. Doing well postoperatively.  Continue current care, ambulate.  Aryav Wimberly D 05/12/2015, 8:15 AM

## 2015-05-13 NOTE — Clinical Social Work Maternal (Signed)
CLINICAL SOCIAL WORK MATERNAL/CHILD NOTE  Patient Details  Name: Anna James MRN: [id number] Date of Birth: 09/21/1980  Date:  05/13/2015  Clinical Social Worker Initiating Note:  Kolston Lacount E. Lesslie Mossa, LCSW Date/ Time Initiated:  05/13/15/1115     Child's Name:  Anna James   Legal Guardian:  Mother (Parents: Jadi Heyward and Abdul James)   Need for Interpreter:  None   Date of Referral:  05/12/15     Reason for Referral:  Other (Comment) (Hx of PPD)   Referral Source:  Central Nursery   Address:  [address] [place], [place] [address]  Phone number:  [phone]   Household Members:  Significant Other   Natural Supports (not living in the home):  Immediate Family, Church, Extended Family (MOB reports that she "stays to myself," but has good support people in her life.)   Professional Supports: Other (Comment) (MOB reports that she has a psychiatrist at [facility].)   Employment:     Type of Work:     Education:      Financial Resources:  Medicaid   Other Resources:  WIC, Food Stamps    Cultural/Religious Considerations Which May Impact Care: None stated.  MOB reports great support from her church family.  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  MOB's 4 other children are in DHHS custody and live with MGM.)   Cognitive State:  Able to Concentrate , Alert , Goal Oriented , Linear Thinking    Mood/Affect:  Interested , Calm , Relaxed    CSW Assessment: CSW met with MOB in her first floor room/127 to complete assessment due to hx of PPD.  MOB was changing babies diaper and appears comfortable with caring for infant.  She was pleasant and welcoming of CSW's visit, although appears tired. MOB reports she and baby are doing well.  She states FOB is involved and supportive, but currently "back home in [place]" for his  father's funeral.  She states he currently flying back to [place].  MOB reports having everything needed for baby at home and is aware of SIDS precautions, which CSW reviewed.  She states transportation can be an issue for her at times, but that currently she has been using her boyfriends car until they can get her's fixed.  She states she has utilized Medicaid transportation when needed.  MOB reports that this baby is the first for her and FOB together, but that he has two children who live with their mother and that she has 4 children who live with MGM.   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  CSW explained that mental health symptoms cannot necessarily be willed away, and asked her about her coping mechanisms to deal with mental health symptoms.  MOB states she plans to stay active and exercise.  She already notes more energy with this pregnancy than her others and notes this is a good sign.  She states she makes goals and accomplishing them keeps her on track.  She also states she plans to resume Zoloft, which she finds beneficial to her.  She states she has a doctor at [facility] and asked to be restarted about a week ago,   but was told they would not restart this medication until after she delivered.  She states she plans to go to [facility] on Monday and will ask to be restarted at that time.  She reports no need for assistance with this.  CSW commends her for her awareness of mental health concerns and for taking necessary steps for treatment.  MOB reports that she does not have a counselor at this time and is not interested in one.  CSW asked that she continue to monitor her feelings and inform her doctor of any concerns.  MOB agreed.  CSW also informed MOB of the Feelings After Birth support group offered at [facility]. CSW informed MOB of notification by CPS worker that  MGM must be present at discharge.  CSW did not ask MOB to talk about her CPS history, but brought this up to ensure that she is aware that her mother must be here at discharge.  MOB states knowledge of this and reports that her mother will be staying with her from discharge until planned meeting on Monday.  MOB appears calm about this situation.  CSW notified Pediatrician of discharge plan.  CSW Plan/Description:  Patient/Family Education , No Further Intervention Required/No Barriers to Discharge, Child Protective Service Report     Murel Wigle Elizabeth, LCSW 05/13/2015, 1:58 PM  

## 2015-05-13 NOTE — Progress Notes (Signed)
POD #2 Sore, no problems Afeb, VSS Abd- soft, fundus firm, incision intact Continue current care

## 2015-05-13 NOTE — Progress Notes (Signed)
CSW spoke with L. McGee/CPS who states she has completed a Safety Plan with MOB and that baby can discharge to MOB's care with supervision from MGM/Brenda Bradley.  Ms. Bradley must be present at baby's discharge. 

## 2015-05-14 MED ORDER — OXYCODONE HCL 5 MG PO TABS: 5.0000 mg | ORAL_TABLET | ORAL | Status: DC | PRN

## 2015-05-14 MED ORDER — IBUPROFEN 600 MG PO TABS: 600.0000 mg | ORAL_TABLET | Freq: Four times a day (QID) | ORAL | Status: DC

## 2015-05-14 NOTE — Discharge Summary (Signed)
OB Discharge Summary     Patient Name: Anna James DOB: 05-15-1980 MRN: [id number]  Date of admission: 05/11/2015 Delivering MD: Jackelyn KnifeMEISINGER, Breyton Vanscyoc   Date of discharge: 05/14/2015  Admitting diagnosis: Repeat C-Section Intrauterine pregnancy: 234w3d     Secondary diagnosis:  Active Problems:   S/P cesarean section      Discharge diagnosis: Term Pregnancy Delivered                                                                                                Complications: None  Hospital course:  Sceduled C/S   35 y.o. yo G4P4005 at 5534w3d was admitted to the hospital 05/11/2015 for scheduled cesarean section with the following indication:Elective Repeat.  Membrane Rupture Time/Date: 7:44 AM ,05/11/2015   Patient delivered a Viable infant.05/11/2015  Details of operation can be found in separate operative note.  Pateint had an uncomplicated postpartum course.  She is ambulating, tolerating a regular diet, passing flatus, and urinating well. Patient is discharged home in stable condition on  05/14/2015          Physical exam  Filed Vitals:   05/12/15 1726 05/13/15 0520 05/13/15 1837 05/14/15 0714  BP: 126/56 127/61 113/59 113/67  Pulse: 108 72 87 91  Temp: 97.9 F (36.6 C) 97.9 F (36.6 C) 98.3 F (36.8 C) 98.2 F (36.8 C)  TempSrc: Oral  Oral Oral  Resp: 18 18 18 18   SpO2:   98%    General: alert Lochia: appropriate Uterine Fundus: firm Incision: Healing well with no significant drainage  Labs: Lab Results  Component Value Date   WBC 7.4 05/12/2015   HGB 8.6* 05/12/2015   HCT 25.8* 05/12/2015   MCV 79.9 05/12/2015   PLT 158 05/12/2015   CMP Latest Ref Rng 11/09/2008  Glucose 70 - 99 mg/dL 88  BUN 6 - 23 mg/dL 4(L)  Creatinine 0.4 - 1.2 mg/dL 4.090.70  Sodium 811135 - 914145 mEq/L 135  Potassium 3.5 - 5.1 mEq/L 3.8  Chloride 96 - 112 mEq/L 104  CO2 19 - 32 mEq/L 24  Calcium 8.4 - 10.5 mg/dL 8.9  Total Protein 6.0 - 8.3 g/dL 6.7  Total Bilirubin 0.3 - 1.2 mg/dL 0.6   Alkaline Phos 39 - 117 U/L 91  AST 0 - 37 U/L 17  ALT 0 - 35 U/L 10    Discharge instruction: per After Visit Summary and "Baby and Me Booklet".  After visit meds:    Medication List    TAKE these medications        ibuprofen 600 MG tablet  Commonly known as:  ADVIL,MOTRIN  Take 1 tablet (600 mg total) by mouth every 6 (six) hours.     OVER THE COUNTER MEDICATION  Apply 1 drop to eye as needed (pt uses an eye drop called Refresh Tears for dry eyes.).     oxyCODONE 5 MG immediate release tablet  Commonly known as:  Oxy IR/ROXICODONE  Take 1 tablet (5 mg total) by mouth every 4 (four) hours as needed.        Diet: routine diet  Activity: Advance  as tolerated. Pelvic rest for 6 weeks.   Outpatient follow up:2 weeks   Newborn Data: Live born female  Birth Weight: 8 lb 3.2 oz (3720 g) APGAR: 8, 9  Baby Feeding: Bottle and Breast Disposition:home with mother   05/14/2015 Zenaida Niece, MD

## 2015-05-14 NOTE — Progress Notes (Signed)
POD #3 Doing well Afeb, VSS Abd- soft, fundus firm, incision intact D/c home 

## 2015-05-14 NOTE — Discharge Instructions (Signed)
As per discharge pamphlet °

## 2015-09-28 DIAGNOSIS — Z719 Counseling, unspecified: Secondary | ICD-10-CM

## 2015-09-28 NOTE — Congregational Nurse Program (Signed)
Congregational Nurse Program Note  Date of Encounter: 09/28/2015  Past Medical History: Past Medical History:  Diagnosis Date  . Depression    PP with 2nd, zoloft with 3rd  . Hx of varicella     Encounter Details:     CNP Questionnaire - 09/28/15 2347      Patient Demographics   Is this a new or existing patient? New   Patient is considered a/an Not Applicable   Race African-American/Black     Patient Assistance   Location of Patient [facility]   Patient's financial/insurance status Medicaid   Uninsured Patient No   Patient referred to apply for the following financial assistance Medicaid   Food insecurities addressed Not Applicable   Transportation assistance No   Assistance securing medications No   Educational health offerings Health literacy     Encounter Details   Primary purpose of visit Education/Health Concerns   Was an Emergency Department visit averted? No   Does patient have a medical provider? Yes   Patient referred to Not Applicable   Was a mental health screening completed? (GAINS tool) No   Does patient have dental issues? No   Does patient have vision issues? Yes   Was a vision referral made? No   Does your patient have an abnormal blood pressure today? No   Since previous encounter, have you referred patient for abnormal blood pressure that resulted in a new diagnosis or medication change? No   Does your patient have an abnormal blood glucose today? No   Since previous encounter, have you referred patient for abnormal blood glucose that resulted in a new diagnosis or medication change? No   Was there a life-saving intervention made? No      [facility]  Gave her information about my role and what we offer for clients.  She reports that she is healthy.  She is being seen by obgyn since her baby's birth 4 months ago.  Client will be starting in September in computer classes.  She has her h.s.diploma.   Will followup when she returns for classes.

## 2016-02-07 ENCOUNTER — Inpatient Hospital Stay (HOSPITAL_COMMUNITY)
Admission: AD | Admit: 2016-02-07 | Discharge: 2016-02-07 | Disposition: A | Discharge status: Home / Self Care | Payer: Medicaid Other | Source: Ambulatory Visit | Attending: Family Medicine | Admitting: Family Medicine

## 2016-02-07 ENCOUNTER — Inpatient Hospital Stay (HOSPITAL_COMMUNITY): Payer: Medicaid Other

## 2016-02-07 ENCOUNTER — Encounter (HOSPITAL_COMMUNITY): Payer: Self-pay | Admitting: *Deleted

## 2016-02-07 DIAGNOSIS — Z3A09 9 weeks gestation of pregnancy: Secondary | ICD-10-CM | POA: Diagnosis not present

## 2016-02-07 DIAGNOSIS — O3481 Maternal care for other abnormalities of pelvic organs, first trimester: Secondary | ICD-10-CM | POA: Diagnosis not present

## 2016-02-07 DIAGNOSIS — R109 Unspecified abdominal pain: Secondary | ICD-10-CM | POA: Diagnosis present

## 2016-02-07 DIAGNOSIS — O26891 Other specified pregnancy related conditions, first trimester: Secondary | ICD-10-CM | POA: Diagnosis not present

## 2016-02-07 DIAGNOSIS — N8311 Corpus luteum cyst of right ovary: Secondary | ICD-10-CM | POA: Insufficient documentation

## 2016-02-07 LAB — CBC WITH DIFFERENTIAL/PLATELET
BASOS ABS: 0 10*3/uL (ref 0.0–0.1)
BASOS PCT: 0 %
Eosinophils Absolute: 0.1 10*3/uL (ref 0.0–0.7)
Eosinophils Relative: 2 %
HEMATOCRIT: 35 % — AB (ref 36.0–46.0)
HEMOGLOBIN: 11.6 g/dL — AB (ref 12.0–15.0)
Lymphocytes Relative: 24 %
Lymphs Abs: 1.6 10*3/uL (ref 0.7–4.0)
MCH: 26.9 pg (ref 26.0–34.0)
MCHC: 33.1 g/dL (ref 30.0–36.0)
MCV: 81.2 fL (ref 78.0–100.0)
MONOS PCT: 4 %
Monocytes Absolute: 0.3 10*3/uL (ref 0.1–1.0)
NEUTROS ABS: 4.8 10*3/uL (ref 1.7–7.7)
NEUTROS PCT: 70 %
Platelets: 195 10*3/uL (ref 150–400)
RBC: 4.31 MIL/uL (ref 3.87–5.11)
RDW: 14.3 % (ref 11.5–15.5)
WBC: 6.8 10*3/uL (ref 4.0–10.5)

## 2016-02-07 LAB — URINALYSIS, ROUTINE W REFLEX MICROSCOPIC
Bilirubin Urine: NEGATIVE
GLUCOSE, UA: NEGATIVE mg/dL
Hgb urine dipstick: NEGATIVE
Ketones, ur: NEGATIVE mg/dL
LEUKOCYTES UA: NEGATIVE
NITRITE: NEGATIVE
PROTEIN: NEGATIVE mg/dL
Specific Gravity, Urine: 1.024 (ref 1.005–1.030)
pH: 5 (ref 5.0–8.0)

## 2016-02-07 LAB — WET PREP, GENITAL
CLUE CELLS WET PREP: NONE SEEN
SPERM: NONE SEEN
TRICH WET PREP: NONE SEEN
YEAST WET PREP: NONE SEEN

## 2016-02-07 LAB — POCT PREGNANCY, URINE: Preg Test, Ur: POSITIVE — AB

## 2016-02-07 LAB — HCG, QUANTITATIVE, PREGNANCY: HCG, BETA CHAIN, QUANT, S: 164856 m[IU]/mL — AB (ref <5)

## 2016-02-07 NOTE — MAU Note (Signed)
Came in for a pregnancy test.  Was having  Pain in lower stomach earlier this morning. No bleeding.  If she is pregnant, wants to know about abortion clinics.  +HPT

## 2016-02-07 NOTE — [facility] Provider Note (Signed)
[facility] Provider Note   CSN: [id number] Arrival date & time: 02/07/16  1315     History   Chief Complaint Chief Complaint  Patient presents with  . Possible Pregnancy  . Abdominal Pain    HPI Anna James is a 35 y.o. [id number] @ 5733w0d gestation who presents to [facility]U with abdominal pain in early pregnancy. Patient reports the pain comes and goes. She describes the pain as sharp in the lower abdomen. She denies n/v/d.   The history is provided by the patient. No language interpreter was used.  Possible Pregnancy  This is a new problem. The current episode started in the past 7 days. The problem occurs intermittently. The problem has been unchanged. Associated symptoms include abdominal pain. Pertinent negatives include no chest pain, chills, fever, headaches, nausea, rash or vomiting. Nothing aggravates the symptoms. She has tried nothing for the symptoms.  Abdominal Pain  The primary symptoms of the illness include abdominal pain and vaginal discharge. The primary symptoms of the illness do not include fever, shortness of breath, nausea, vomiting, dysuria or vaginal bleeding.  The vaginal discharge is not associated with dysuria.   Additional symptoms associated with the illness include frequency. Symptoms associated with the illness do not include chills, constipation or back pain.    Past Medical History:  Diagnosis Date  . Depression    PP with 2nd, zoloft with 3rd  . Hx of varicella     Patient Active Problem List   Diagnosis Date Noted  . S/P cesarean section 05/11/2015    Past Surgical History:  Procedure Laterality Date  . CESAREAN SECTION    . CESAREAN SECTION N/A 05/11/2015   Procedure: CESAREAN SECTION;  Surgeon: Lavina Hammanodd Meisinger, MD;  Location: [facility];  Service: Obstetrics;  Laterality: N/A;    OB History    Gravida Para Term Preterm AB Living   5 4 4     5    SAB TAB Ectopic Multiple Live Births         1 5       Home Medications    Prior  to Admission medications   Medication Sig Start Date End Date Taking? Authorizing Provider  aspirin-sod bicarb-citric acid (ALKA-SELTZER) 325 MG TBEF tablet Take 325 mg by mouth every 6 (six) hours as needed.   Yes Historical Provider, MD  ibuprofen (ADVIL,MOTRIN) 600 MG tablet Take 1 tablet (600 mg total) by mouth every 6 (six) hours. Patient not taking: Reported on 02/07/2016 05/14/15   Lavina Hammanodd Meisinger, MD    Family History History reviewed. No pertinent family history.  Social History Social History  Substance Use Topics  . Smoking status: Never Smoker  . Smokeless tobacco: Never Used  . Alcohol use No     Allergies   Patient has no known allergies.   Review of Systems Review of Systems  Constitutional: Negative for chills and fever.  HENT: Positive for ear pain.   Eyes: Negative for visual disturbance.  Respiratory: Negative for chest tightness and shortness of breath.   Cardiovascular: Negative for chest pain.  Gastrointestinal: Positive for abdominal pain. Negative for constipation, nausea and vomiting.  Genitourinary: Positive for frequency and vaginal discharge. Negative for decreased urine volume, dysuria and vaginal bleeding.  Musculoskeletal: Negative for back pain and neck stiffness.  Skin: Negative for rash.  Neurological: Negative for dizziness, syncope and headaches.  Psychiatric/Behavioral: Negative for confusion. The patient is not nervous/anxious.      Physical Exam Updated Vital Signs BP 114/71 (BP  Location: Right Arm)   Pulse 90   Temp 98.1 F (36.7 C) (Oral)   Resp 18   Wt 190 lb (86.2 kg)   LMP 12/06/2015   BMI 31.62 kg/m   Physical Exam  Constitutional: She is oriented to person, place, and time. She appears well-developed and well-nourished.  HENT:  Head: Normocephalic and atraumatic.  Eyes: EOM are normal.  Neck: Neck supple.  Cardiovascular: Normal rate.   Pulmonary/Chest: Effort normal.  Abdominal: Soft. There is no tenderness.    Genitourinary:  Genitourinary Comments: External genitalia without lesions, creamy white d/c vaginal vault. No CMT, cervix long, closed, uterus approximately 10 week size. Tender with palpation right adnexa.   Musculoskeletal: Normal range of motion.  Neurological: She is alert and oriented to person, place, and time. No cranial nerve deficit.  Skin: Skin is warm and dry.  Psychiatric: She has a normal mood and affect. Her behavior is normal.  Nursing note and vitals reviewed.    ED Treatments / Results  Labs (all labs ordered are listed, but only abnormal results are displayed) Labs Reviewed  WET PREP, GENITAL - Abnormal; Notable for the following:       Result Value   WBC, Wet Prep HPF POC MODERATE (*)    All other components within normal limits  URINALYSIS, ROUTINE W REFLEX MICROSCOPIC - Abnormal; Notable for the following:    APPearance HAZY (*)    All other components within normal limits  CBC WITH DIFFERENTIAL/PLATELET - Abnormal; Notable for the following:    Hemoglobin 11.6 (*)    HCT 35.0 (*)    All other components within normal limits  HCG, QUANTITATIVE, PREGNANCY - Abnormal; Notable for the following:    hCG, Beta Chain, Quant, S 161,096 (*)    All other components within normal limits  POCT PREGNANCY, URINE - Abnormal; Notable for the following:    Preg Test, Ur POSITIVE (*)    All other components within normal limits  RPR  HIV ANTIBODY (ROUTINE TESTING)  GC/CHLAMYDIA PROBE AMP ([place]) NOT AT [facility]   Radiology US Ob Comp Less 14 Wks  Result Date: 02/07/2016 CLINICAL DATA:  35 year old pregnant female presents with abdominal pain. Quantitative beta HCG [id number]. EDC by LMP: 09/11/2016, projecting to an expected gestational age of [redacted] weeks 0 days. EXAM: OBSTETRIC <14 WK [place] AND TRANSVAGINAL OB US TECHNIQUE: Both transabdominal and transvaginal ultrasound examinations were performed for complete evaluation of the gestation as well as the maternal uterus, adnexal  regions, and pelvic cul-de-sac. Transvaginal technique was performed to assess early pregnancy. COMPARISON:  No prior scans from this gestation. FINDINGS: Intrauterine gestational sac: Single intrauterine gestational sac appears normal in size, shape and position. Yolk sac:  Visualized. Fetus: Visualized. Fetal anatomy not assessed at this early gestational age. Fetal Cardiac Activity: Regular rate and rhythm. Fetal Heart Rate: 174  bpm CRL:  26.8  mm   9 w   3 d                  [place] EDC: 09/08/2016 Subchorionic hemorrhage:  None visualized. Maternal uterus/adnexae: Right ovary measures 2.9 x 2.4 x 2.7 cm and contains a corpus luteum. Left ovary measures 2.4 x 1.8 x 2.1 cm. No suspicious ovarian or adnexal masses. No abnormal free fluid in the pelvis. No uterine fibroids are demonstrated. IMPRESSION: 1. Single living intrauterine gestation at 9 weeks 3 days by crown-rump length, with no significant discrepancy with the expected gestational age of [redacted] weeks 0 days  by provided menstrual dating. 2. No first-trimester gestational abnormality. Electronically Signed   By: Delbert PhenixJason A Poff M.D.   On: 02/07/2016 16:33   [place] Ob Transvaginal  Result Date: 02/07/2016 CLINICAL DATA:  35 year old pregnant female presents with abdominal pain. Quantitative beta HCG [id number]. EDC by LMP: 09/11/2016, projecting to an expected gestational age of [redacted] weeks 0 days. EXAM: OBSTETRIC <14 WK US AND TRANSVAGINAL OB US TECHNIQUE: Both transabdominal and transvaginal ultrasound examinations were performed for complete evaluation of the gestation as well as the maternal uterus, adnexal regions, and pelvic cul-de-sac. Transvaginal technique was performed to assess early pregnancy. COMPARISON:  No prior scans from this gestation. FINDINGS: Intrauterine gestational sac: Single intrauterine gestational sac appears normal in size, shape and position. Yolk sac:  Visualized. Fetus: Visualized. Fetal anatomy not assessed at this early gestational age. Fetal  Cardiac Activity: Regular rate and rhythm. Fetal Heart Rate: 174  bpm CRL:  26.8  mm   9 w   3 d                  US EDC: 09/08/2016 Subchorionic hemorrhage:  None visualized. Maternal uterus/adnexae: Right ovary measures 2.9 x 2.4 x 2.7 cm and contains a corpus luteum. Left ovary measures 2.4 x 1.8 x 2.1 cm. No suspicious ovarian or adnexal masses. No abnormal free fluid in the pelvis. No uterine fibroids are demonstrated. IMPRESSION: 1. Single living intrauterine gestation at 9 weeks 3 days by crown-rump length, with no significant discrepancy with the expected gestational age of [redacted] weeks 0 days by provided menstrual dating. 2. No first-trimester gestational abnormality. Electronically Signed   By: Delbert PhenixJason A Poff M.D.   On: 02/07/2016 16:33    Procedures Procedures (including critical care time)  Medications Ordered in ED Medications - No data to display   Initial Impression / Assessment and Plan / ED Course  I have reviewed the triage vital signs and the nursing notes.  Pertinent labs & imaging results that were available during my care of the patient were reviewed by me and considered in my medical decision making (see chart for details).  Clinical Course   35 y.o. [id number] @ [redacted]w[redacted]d with lower abdominal pain stable for d/c without surgical abdomen. Discussed with the patient lab and ultrasound results and plans for follow up. Return precautions given.   Final Clinical Impressions(s) / ED Diagnoses   Final diagnoses:  Abdominal pain in pregnancy, first trimester    New Prescriptions Current Discharge Medication List

## 2016-02-07 NOTE — Discharge Instructions (Signed)
Abdominal Pain During Pregnancy °Belly (abdominal) pain is common during pregnancy. Most of the time, it is not a serious problem. Other times, it can be a sign that something is wrong with the pregnancy. Always tell your doctor if you have belly pain. °Follow these instructions at home: °Monitor your belly pain for any changes. The following actions may help you feel better: °· Do not have sex (intercourse) or put anything in your vagina until you feel better. °· Rest until your pain stops. °· Drink clear fluids if you feel sick to your stomach (nauseous). Do not eat solid food until you feel better. °· Only take medicine as told by your doctor. °· Keep all doctor visits as told. °Get help right away if: °· You are bleeding, leaking fluid, or pieces of tissue come out of your vagina. °· You have more pain or cramping. °· You keep throwing up (vomiting). °· You have pain when you pee (urinate) or have blood in your pee. °· You have a fever. °· You do not feel your baby moving as much. °· You feel very weak or feel like passing out. °· You have trouble breathing, with or without belly pain. °· You have a very bad headache and belly pain. °· You have fluid leaking from your vagina and belly pain. °· You keep having watery poop (diarrhea). °· Your belly pain does not go away after resting, or the pain gets worse. °This information is not intended to replace advice given to you by your health care provider. Make sure you discuss any questions you have with your health care provider. °Document Released: 01/16/2009 Document Revised: 09/06/2015 Document Reviewed: 08/27/2012 °Elsevier Interactive Patient Education © 2017 Elsevier Inc. ° °

## 2016-02-08 LAB — GC/CHLAMYDIA PROBE AMP (~~LOC~~) NOT AT ARMC
CHLAMYDIA, DNA PROBE: NEGATIVE
Neisseria Gonorrhea: NEGATIVE

## 2016-02-08 LAB — RPR: RPR Ser Ql: NONREACTIVE

## 2016-02-08 LAB — HIV ANTIBODY (ROUTINE TESTING W REFLEX): HIV Screen 4th Generation wRfx: NONREACTIVE

## 2016-03-20 ENCOUNTER — Encounter: Payer: Medicaid Other | Admitting: Advanced Practice Midwife

## 2016-04-08 ENCOUNTER — Encounter: Payer: Medicaid Other | Admitting: Obstetrics and Gynecology

## 2016-04-16 ENCOUNTER — Encounter: Payer: Medicaid Other | Admitting: Obstetrics & Gynecology

## 2016-04-24 ENCOUNTER — Encounter: Payer: Medicaid Other | Admitting: Obstetrics and Gynecology

## 2016-05-02 ENCOUNTER — Encounter: Payer: Self-pay | Admitting: Family Medicine

## 2016-05-02 ENCOUNTER — Other Ambulatory Visit (HOSPITAL_COMMUNITY)
Admission: RE | Admit: 2016-05-02 | Discharge: 2016-05-02 | Disposition: A | Discharge status: Home / Self Care | Payer: Medicaid Other | Source: Ambulatory Visit | Attending: Family Medicine | Admitting: Family Medicine

## 2016-05-02 ENCOUNTER — Ambulatory Visit (INDEPENDENT_AMBULATORY_CARE_PROVIDER_SITE_OTHER): Payer: Medicaid Other | Admitting: Family Medicine

## 2016-05-02 VITALS — BP 122/62 | HR 93 | Wt 197.9 lb

## 2016-05-02 DIAGNOSIS — O09529 Supervision of elderly multigravida, unspecified trimester: Secondary | ICD-10-CM

## 2016-05-02 DIAGNOSIS — O09522 Supervision of elderly multigravida, second trimester: Secondary | ICD-10-CM | POA: Diagnosis not present

## 2016-05-02 DIAGNOSIS — O34219 Maternal care for unspecified type scar from previous cesarean delivery: Secondary | ICD-10-CM | POA: Diagnosis not present

## 2016-05-02 DIAGNOSIS — Z3A00 Weeks of gestation of pregnancy not specified: Secondary | ICD-10-CM | POA: Insufficient documentation

## 2016-05-02 DIAGNOSIS — Z98891 History of uterine scar from previous surgery: Secondary | ICD-10-CM

## 2016-05-02 LAB — POCT URINALYSIS DIP (DEVICE)
Bilirubin Urine: NEGATIVE
GLUCOSE, UA: NEGATIVE mg/dL
Hgb urine dipstick: NEGATIVE
Ketones, ur: NEGATIVE mg/dL
Leukocytes, UA: NEGATIVE
NITRITE: NEGATIVE
PH: 5.5 (ref 5.0–8.0)
PROTEIN: NEGATIVE mg/dL
Specific Gravity, Urine: >=1.03 (ref 1.005–1.030)
UROBILINOGEN UA: 0.2 mg/dL (ref 0.0–1.0)

## 2016-05-02 LAB — GLUCOSE, CAPILLARY: Glucose-Capillary: 106 mg/dL — ABNORMAL HIGH (ref 65–99)

## 2016-05-02 NOTE — Progress Notes (Signed)
Declines flu vaccine.

## 2016-05-02 NOTE — Progress Notes (Signed)
  Subjective:    Anna James is a [id number] 5068w1d being seen today for her first obstetrical visit.  Her obstetrical history is significant for advanced maternal age and history of 4 cesareans. Patient does not intend to breast feed. Pregnancy history fully reviewed.  Patient reports no complaints.  Vitals:   05/02/16 1301  BP: 122/62  Pulse: 93  Weight: 197 lb 14.4 oz (89.8 kg)    HISTORY: OB History  Gravida Para Term Preterm AB Living  5 4 4     5   SAB TAB Ectopic Multiple Live Births        1 5    # Outcome Date GA Lbr Len/2nd Weight Sex Delivery Anes PTL Lv  5 Current           4 Term 05/11/15 216w3d  8 lb 3.2 oz (3.72 kg) F CS-LTranv Spinal  LIV  3A Term 2010 4531w0d  5 lb 10 oz (2.551 kg) F CS-LTranv   LIV  3B Term 2010 6031w0d  4 lb 15 oz (2.24 kg) F CS-LTranv   LIV  2 Term 2009 7126w0d  7 lb 13 oz (3.544 kg) M CS-LTranv   LIV  1 Term 2008 7041w0d  9 lb 8 oz (4.309 kg) M CS-LTranv EPI  LIV     Past Medical History:  Diagnosis Date  . Depression    PP with 2nd, zoloft with 3rd  . Hx of varicella    Past Surgical History:  Procedure Laterality Date  . CESAREAN SECTION    . CESAREAN SECTION N/A 05/11/2015   Procedure: CESAREAN SECTION;  Surgeon: Lavina Hammanodd Meisinger, MD;  Location: [facility];  Service: Obstetrics;  Laterality: N/A;   History reviewed. No pertinent family history.   Exam    Uterus:     Pelvic Exam:    Perineum: No Hemorrhoids, Normal Perineum   Vulva: Bartholin's, Urethra, Skene's normal   Vagina:  normal mucosa   Cervix: multiparous appearance   Bony Pelvis: gynecoid  System: Breast:  normal appearance, no masses or tenderness   Skin: normal coloration and turgor, no rashes    Neurologic: gait normal; reflexes normal and symmetric   Extremities: normal strength, tone, and muscle mass   HEENT PERRLA and extra ocular movement intact   Mouth/Teeth mucous membranes moist, pharynx normal without lesions   Neck supple and no masses   Cardiovascular:  regular rate and rhythm, no murmurs or gallops   Respiratory:  appears well, vitals normal, no respiratory distress, acyanotic, normal RR, ear and throat exam is normal, neck free of mass or lymphadenopathy, chest clear, no wheezing, crepitations, rhonchi, normal symmetric air entry   Abdomen: soft, non-tender; bowel sounds normal; no masses,  no organomegaly   Urinary: urethral meatus normal      Assessment:    Pregnancy: [id number] Patient Active Problem List   Diagnosis Date Noted  . Supervision of high-risk pregnancy of elderly multigravida (>= 38 years old at time of delivery) 05/02/2016  . H/O cesarean section 05/11/2015        Plan:     Initial labs drawn. Prenatal vitamins. Problem list reviewed and updated. Genetic Screening discussed Quad Screen: ordered.  Ultrasound discussed; fetal survey: ordered.  Follow up in 4 weeks. 50% of 30 min visit spent on counseling and coordination of care.     Anna James 05/02/2016

## 2016-05-03 LAB — OBSTETRIC PANEL, INCLUDING HIV
Antibody Screen: NEGATIVE
Basophils Absolute: 0 10*3/uL (ref 0.0–0.2)
Basos: 0 %
EOS (ABSOLUTE): 0.2 10*3/uL (ref 0.0–0.4)
EOS: 3 %
HEMOGLOBIN: 10.4 g/dL — AB (ref 11.1–15.9)
HEP B S AG: NEGATIVE
HIV Screen 4th Generation wRfx: NONREACTIVE
Hematocrit: 32.9 % — ABNORMAL LOW (ref 34.0–46.6)
IMMATURE GRANULOCYTES: 0 %
Immature Grans (Abs): 0 10*3/uL (ref 0.0–0.1)
LYMPHS ABS: 1.9 10*3/uL (ref 0.7–3.1)
Lymphs: 23 %
MCH: 25.9 pg — ABNORMAL LOW (ref 26.6–33.0)
MCHC: 31.6 g/dL (ref 31.5–35.7)
MCV: 82 fL (ref 79–97)
Monocytes Absolute: 0.7 10*3/uL (ref 0.1–0.9)
Monocytes: 9 %
NEUTROS ABS: 5.4 10*3/uL (ref 1.4–7.0)
NEUTROS PCT: 65 %
Platelets: 215 10*3/uL (ref 150–379)
RBC: 4.02 x10E6/uL (ref 3.77–5.28)
RDW: 15.5 % — ABNORMAL HIGH (ref 12.3–15.4)
RH TYPE: POSITIVE
RPR: NONREACTIVE
Rubella Antibodies, IGG: 1.89 index (ref >0.99)
WBC: 8.3 10*3/uL (ref 3.4–10.8)

## 2016-05-03 LAB — HEMOGLOBIN A1C
Est. average glucose Bld gHb Est-mCnc: 108 mg/dL
Hgb A1c MFr Bld: 5.4 % (ref 4.8–5.6)

## 2016-05-03 LAB — CERVICOVAGINAL ANCILLARY ONLY
CHLAMYDIA, DNA PROBE: NEGATIVE
Neisseria Gonorrhea: NEGATIVE

## 2016-05-04 LAB — URINE CULTURE, OB REFLEX

## 2016-05-04 LAB — CULTURE, OB URINE

## 2016-05-06 LAB — HEMOGLOBINOPATHY EVALUATION
FERRITIN: 6 ng/mL — AB (ref 15–150)
Hematocrit: 31.9 % — ABNORMAL LOW (ref 34.0–46.6)
Hemoglobin: 10.5 g/dL — ABNORMAL LOW (ref 11.1–15.9)
Hgb A2 Quant: 2.4 % (ref 1.8–3.2)
Hgb A: 97.6 % (ref 96.4–98.8)
Hgb C: 0 %
Hgb F Quant: 0 % (ref 0.0–2.0)
Hgb S: 0 %
Hgb Solubility: NEGATIVE
Hgb Variant: 0 %
MCH: 26.6 pg (ref 26.6–33.0)
MCHC: 32.9 g/dL (ref 31.5–35.7)
MCV: 81 fL (ref 79–97)
PLATELETS: 206 10*3/uL (ref 150–379)
RBC: 3.95 x10E6/uL (ref 3.77–5.28)
RDW: 15.6 % — ABNORMAL HIGH (ref 12.3–15.4)
WBC: 7.8 10*3/uL (ref 3.4–10.8)

## 2016-05-07 LAB — AFP, QUAD SCREEN
DIA Mom Value: 0.68
DIA Value (EIA): 124.34 pg/mL
DSR (BY AGE) 1 IN: 147
DSR (SECOND TRIMESTER) 1 IN: 8582
Gestational Age: 21.1 WEEKS
MSAFP MOM: 1.35
MSAFP: 81.3 ng/mL
MSHCG Mom: 1.36
MSHCG: 26103 m[IU]/mL
Maternal Age At EDD: 38 YEARS
OSB RISK: 8303
TEST RESULTS AFP: NEGATIVE
UE3 MOM: 1.39
WEIGHT: 197 [lb_av]
uE3 Value: 2.79 ng/mL

## 2016-05-20 ENCOUNTER — Ambulatory Visit (HOSPITAL_COMMUNITY): Admission: RE | Admit: 2016-05-20 | Payer: Medicaid Other | Source: Ambulatory Visit

## 2016-05-20 ENCOUNTER — Ambulatory Visit (HOSPITAL_COMMUNITY): Payer: Medicaid Other

## 2016-05-31 ENCOUNTER — Ambulatory Visit (HOSPITAL_COMMUNITY)
Admission: RE | Admit: 2016-05-31 | Discharge: 2016-05-31 | Disposition: A | Discharge status: Home / Self Care | Payer: Medicaid Other | Source: Ambulatory Visit | Attending: Family Medicine | Admitting: Family Medicine

## 2016-05-31 ENCOUNTER — Ambulatory Visit (INDEPENDENT_AMBULATORY_CARE_PROVIDER_SITE_OTHER): Payer: Medicaid Other | Admitting: Obstetrics & Gynecology

## 2016-05-31 ENCOUNTER — Other Ambulatory Visit: Payer: Self-pay | Admitting: Family Medicine

## 2016-05-31 ENCOUNTER — Encounter (HOSPITAL_COMMUNITY): Payer: Medicaid Other

## 2016-05-31 ENCOUNTER — Encounter (HOSPITAL_COMMUNITY): Payer: Self-pay

## 2016-05-31 VITALS — BP 127/72 | HR 91

## 2016-05-31 VITALS — BP 114/68 | HR 93 | Wt 198.6 lb

## 2016-05-31 DIAGNOSIS — O09522 Supervision of elderly multigravida, second trimester: Secondary | ICD-10-CM | POA: Diagnosis not present

## 2016-05-31 DIAGNOSIS — O09529 Supervision of elderly multigravida, unspecified trimester: Secondary | ICD-10-CM

## 2016-05-31 DIAGNOSIS — O34219 Maternal care for unspecified type scar from previous cesarean delivery: Secondary | ICD-10-CM

## 2016-05-31 DIAGNOSIS — E669 Obesity, unspecified: Secondary | ICD-10-CM | POA: Insufficient documentation

## 2016-05-31 DIAGNOSIS — O99212 Obesity complicating pregnancy, second trimester: Secondary | ICD-10-CM

## 2016-05-31 DIAGNOSIS — Z362 Encounter for other antenatal screening follow-up: Secondary | ICD-10-CM | POA: Diagnosis present

## 2016-05-31 DIAGNOSIS — Z3A25 25 weeks gestation of pregnancy: Secondary | ICD-10-CM | POA: Insufficient documentation

## 2016-05-31 DIAGNOSIS — Z369 Encounter for antenatal screening, unspecified: Secondary | ICD-10-CM

## 2016-05-31 DIAGNOSIS — Z98891 History of uterine scar from previous surgery: Secondary | ICD-10-CM

## 2016-05-31 DIAGNOSIS — F5089 Other specified eating disorder: Secondary | ICD-10-CM | POA: Diagnosis not present

## 2016-05-31 DIAGNOSIS — F5083 Pica in adults: Secondary | ICD-10-CM | POA: Insufficient documentation

## 2016-05-31 NOTE — Patient Instructions (Signed)
Second Trimester of Pregnancy The second trimester is from week 13 through week 28, month 4 through 6. This is often the time in pregnancy that you feel your best. Often times, morning sickness has lessened or quit. You may have more energy, and you may get hungry more often. Your unborn baby (fetus) is growing rapidly. At the end of the sixth month, he or she is about 9 inches long and weighs about 1 pounds. You will likely feel the baby move (quickening) between 18 and 20 weeks of pregnancy. Follow these instructions at home:  Avoid all smoking, herbs, and alcohol. Avoid drugs not approved by your doctor.  Do not use any tobacco products, including cigarettes, chewing tobacco, and electronic cigarettes. If you need help quitting, ask your doctor. You may get counseling or other support to help you quit.  Only take medicine as told by your doctor. Some medicines are safe and some are not during pregnancy.  Exercise only as told by your doctor. Stop exercising if you start having cramps.  Eat regular, healthy meals.  Wear a good support bra if your breasts are tender.  Do not use hot tubs, steam rooms, or saunas.  Wear your seat belt when driving.  Avoid raw meat, uncooked cheese, and liter boxes and soil used by cats.  Take your prenatal vitamins.  Take 1500-2000 milligrams of calcium daily starting at the 20th week of pregnancy until you deliver your baby.  Try taking medicine that helps you poop (stool softener) as needed, and if your doctor approves. Eat more fiber by eating fresh fruit, vegetables, and whole grains. Drink enough fluids to keep your pee (urine) clear or pale yellow.  Take warm water baths (sitz baths) to soothe pain or discomfort caused by hemorrhoids. Use hemorrhoid cream if your doctor approves.  If you have puffy, bulging veins (varicose veins), wear support hose. Raise (elevate) your feet for 15 minutes, 3-4 times a day. Limit salt in your diet.  Avoid heavy  lifting, wear low heals, and sit up straight.  Rest with your legs raised if you have leg cramps or low back pain.  Visit your dentist if you have not gone during your pregnancy. Use a soft toothbrush to brush your teeth. Be gentle when you floss.  You can have sex (intercourse) unless your doctor tells you not to.  Go to your doctor visits. Get help if:  You feel dizzy.  You have mild cramps or pressure in your lower belly (abdomen).  You have a nagging pain in your belly area.  You continue to feel sick to your stomach (nauseous), throw up (vomit), or have watery poop (diarrhea).  You have bad smelling fluid coming from your vagina.  You have pain with peeing (urination). Get help right away if:  You have a fever.  You are leaking fluid from your vagina.  You have spotting or bleeding from your vagina.  You have severe belly cramping or pain.  You lose or gain weight rapidly.  You have trouble catching your breath and have chest pain.  You notice sudden or extreme puffiness (swelling) of your face, hands, ankles, feet, or legs.  You have not felt the baby move in over an hour.  You have severe headaches that do not go away with medicine.  You have vision changes. This information is not intended to replace advice given to you by your health care provider. Make sure you discuss any questions you have with your health care   provider. Document Released: 04/24/2009 Document Revised: 07/06/2015 Document Reviewed: 03/31/2012 Elsevier Interactive Patient Education  2017 Elsevier Inc.  

## 2016-05-31 NOTE — Progress Notes (Signed)
   PRENATAL VISIT NOTE  Subjective:  Anna James is a 36 y.o. [id number] at [redacted]w[redacted]d being seen today for ongoing prenatal care.  She is currently monitored for the following issues for this low-risk pregnancy and has H/O cesarean section and Supervision of high-risk pregnancy of elderly multigravida (>= 71 years old at time of delivery) on her problem list.  Patient reports no complaints.  Contractions: Not present. Vag. Bleeding: None.  Movement: Present. Denies leaking of fluid.   The following portions of the patient's history were reviewed and updated as appropriate: allergies, current medications, past family history, past medical history, past social history, past surgical history and problem list. Problem list updated.  Objective:   Vitals:   05/31/16 0814  BP: 114/68  Pulse: 93  Weight: 198 lb 9.6 oz (90.1 kg)    Fetal Status: Fetal Heart Rate (bpm): 149   Movement: Present     General:  Alert, oriented and cooperative. Patient is in no acute distress.  Skin: Skin is warm and dry. No rash noted.   Cardiovascular: Normal heart rate noted  Respiratory: Normal respiratory effort, no problems with respiration noted  Abdomen: Soft, gravid, appropriate for gestational age. Pain/Pressure: Present     Pelvic:  Cervical exam deferred        Extremities: Normal range of motion.  Edema: Trace  Mental Status: Normal mood and affect. Normal behavior. Normal judgment and thought content.   Assessment and Plan:  Pregnancy: G5P4005 at [redacted]w[redacted]d  1. Supervision of high-risk pregnancy of elderly multigravida (>= 69 years old at time of delivery) Scheduled for [place] today  2. H/O cesarean section Repeat at 39 weeks  Preterm labor symptoms and general obstetric precautions including but not limited to vaginal bleeding, contractions, leaking of fluid and fetal movement were reviewed in detail with the patient. Please refer to After Visit Summary for other counseling recommendations.  Return in  about 3 weeks (around 06/21/2016) for obfu/ 2 hr gtt.   Adam Phenix, MD

## 2016-06-21 ENCOUNTER — Encounter: Payer: Medicaid Other | Admitting: Obstetrics & Gynecology

## 2016-06-28 ENCOUNTER — Ambulatory Visit (HOSPITAL_COMMUNITY): Payer: Medicaid Other

## 2016-07-04 ENCOUNTER — Encounter (HOSPITAL_COMMUNITY): Payer: Self-pay

## 2016-07-04 ENCOUNTER — Other Ambulatory Visit (HOSPITAL_COMMUNITY): Payer: Self-pay | Admitting: Obstetrics and Gynecology

## 2016-07-04 ENCOUNTER — Ambulatory Visit (HOSPITAL_COMMUNITY)
Admission: RE | Admit: 2016-07-04 | Discharge: 2016-07-04 | Disposition: A | Discharge status: Home / Self Care | Payer: Medicaid Other | Source: Ambulatory Visit | Attending: Obstetrics and Gynecology | Admitting: Obstetrics and Gynecology

## 2016-07-04 DIAGNOSIS — O34219 Maternal care for unspecified type scar from previous cesarean delivery: Secondary | ICD-10-CM

## 2016-07-04 DIAGNOSIS — O99213 Obesity complicating pregnancy, third trimester: Secondary | ICD-10-CM

## 2016-07-04 DIAGNOSIS — O0933 Supervision of pregnancy with insufficient antenatal care, third trimester: Secondary | ICD-10-CM

## 2016-07-04 DIAGNOSIS — Z3A30 30 weeks gestation of pregnancy: Secondary | ICD-10-CM | POA: Diagnosis not present

## 2016-07-04 DIAGNOSIS — O09523 Supervision of elderly multigravida, third trimester: Secondary | ICD-10-CM

## 2016-07-04 DIAGNOSIS — O09529 Supervision of elderly multigravida, unspecified trimester: Secondary | ICD-10-CM

## 2016-07-10 ENCOUNTER — Other Ambulatory Visit (HOSPITAL_COMMUNITY): Payer: Self-pay | Admitting: *Deleted

## 2016-07-10 DIAGNOSIS — O359XX0 Maternal care for (suspected) fetal abnormality and damage, unspecified, not applicable or unspecified: Secondary | ICD-10-CM

## 2016-07-16 ENCOUNTER — Ambulatory Visit (HOSPITAL_COMMUNITY): Payer: Medicaid Other | Attending: Family Medicine

## 2016-07-16 ENCOUNTER — Encounter (HOSPITAL_COMMUNITY): Payer: Self-pay

## 2016-08-01 ENCOUNTER — Encounter (HOSPITAL_COMMUNITY): Payer: Self-pay

## 2016-08-01 ENCOUNTER — Ambulatory Visit (HOSPITAL_COMMUNITY)
Admission: RE | Admit: 2016-08-01 | Discharge: 2016-08-01 | Disposition: A | Discharge status: Home / Self Care | Payer: Medicaid Other | Source: Ambulatory Visit | Attending: Family Medicine | Admitting: Family Medicine

## 2016-08-01 DIAGNOSIS — Z6832 Body mass index (BMI) 32.0-32.9, adult: Secondary | ICD-10-CM | POA: Diagnosis not present

## 2016-08-01 DIAGNOSIS — Z3A34 34 weeks gestation of pregnancy: Secondary | ICD-10-CM | POA: Diagnosis not present

## 2016-08-01 DIAGNOSIS — O359XX0 Maternal care for (suspected) fetal abnormality and damage, unspecified, not applicable or unspecified: Secondary | ICD-10-CM | POA: Diagnosis not present

## 2016-08-01 DIAGNOSIS — O34219 Maternal care for unspecified type scar from previous cesarean delivery: Secondary | ICD-10-CM | POA: Diagnosis not present

## 2016-08-01 DIAGNOSIS — O09523 Supervision of elderly multigravida, third trimester: Secondary | ICD-10-CM | POA: Diagnosis not present

## 2016-08-01 DIAGNOSIS — O99213 Obesity complicating pregnancy, third trimester: Secondary | ICD-10-CM | POA: Diagnosis not present

## 2016-08-01 DIAGNOSIS — O0933 Supervision of pregnancy with insufficient antenatal care, third trimester: Secondary | ICD-10-CM | POA: Diagnosis not present

## 2016-08-01 DIAGNOSIS — E669 Obesity, unspecified: Secondary | ICD-10-CM | POA: Insufficient documentation

## 2016-08-05 ENCOUNTER — Ambulatory Visit (INDEPENDENT_AMBULATORY_CARE_PROVIDER_SITE_OTHER): Payer: Medicaid Other | Admitting: Family Medicine

## 2016-08-05 ENCOUNTER — Encounter (HOSPITAL_COMMUNITY): Payer: Self-pay

## 2016-08-05 ENCOUNTER — Other Ambulatory Visit (HOSPITAL_COMMUNITY)
Admission: RE | Admit: 2016-08-05 | Discharge: 2016-08-05 | Disposition: A | Discharge status: Home / Self Care | Payer: Medicaid Other | Source: Ambulatory Visit | Attending: Family Medicine | Admitting: Family Medicine

## 2016-08-05 VITALS — BP 118/60 | HR 61 | Wt 197.8 lb

## 2016-08-05 DIAGNOSIS — O09523 Supervision of elderly multigravida, third trimester: Secondary | ICD-10-CM | POA: Insufficient documentation

## 2016-08-05 DIAGNOSIS — O09529 Supervision of elderly multigravida, unspecified trimester: Secondary | ICD-10-CM

## 2016-08-05 DIAGNOSIS — N898 Other specified noninflammatory disorders of vagina: Secondary | ICD-10-CM

## 2016-08-05 DIAGNOSIS — O26893 Other specified pregnancy related conditions, third trimester: Secondary | ICD-10-CM | POA: Insufficient documentation

## 2016-08-05 DIAGNOSIS — Z113 Encounter for screening for infections with a predominantly sexual mode of transmission: Secondary | ICD-10-CM | POA: Diagnosis not present

## 2016-08-05 DIAGNOSIS — Z3A35 35 weeks gestation of pregnancy: Secondary | ICD-10-CM | POA: Insufficient documentation

## 2016-08-05 DIAGNOSIS — O34219 Maternal care for unspecified type scar from previous cesarean delivery: Secondary | ICD-10-CM

## 2016-08-05 DIAGNOSIS — Z98891 History of uterine scar from previous surgery: Secondary | ICD-10-CM

## 2016-08-05 NOTE — Progress Notes (Signed)
   PRENATAL VISIT NOTE  Subjective:  Anna James is a 36 y.o. [id number] at 244w1d being seen today for ongoing prenatal care.  She is currently monitored for the following issues for this high-risk pregnancy and has H/O cesarean section; Supervision of high-risk pregnancy of elderly multigravida (>= 38 years old at time of delivery); and [place] in adults on her problem list.  Patient reports no complaints.  Contractions: Not present. Vag. Bleeding: None.  Movement: Present. Denies leaking of fluid.   The following portions of the patient's history were reviewed and updated as appropriate: allergies, current medications, past family history, past medical history, past social history, past surgical history and problem list. Problem list updated.  Objective:   Vitals:   08/05/16 1448  BP: 118/60  Pulse: 61  Weight: 197 lb 12.8 oz (89.7 kg)    Fetal Status: Fetal Heart Rate (bpm): 152   Movement: Present     General:  Alert, oriented and cooperative. Patient is in no acute distress.  Skin: Skin is warm and dry. No rash noted.   Cardiovascular: Normal heart rate noted  Respiratory: Normal respiratory effort, no problems with respiration noted  Abdomen: Soft, gravid, appropriate for gestational age. Pain/Pressure: Present     Pelvic:  Cervical exam deferred        Extremities: Normal range of motion.  Edema: None  Mental Status: Normal mood and affect. Normal behavior. Normal judgment and thought content.   Assessment and Plan:  Pregnancy: G5P4005 at 424w1d  1. Supervision of high-risk pregnancy of elderly multigravida (>= 38 years old at time of delivery) FHT and FH normal - Culture, beta strep (group b only) - Cervicovaginal ancillary only  2. H/O cesarean section Schedule for 39 weeks  3. Vaginal discharge during pregnancy in third trimester Wet prep sent - Cervicovaginal ancillary only  Preterm labor symptoms and general obstetric precautions including but not limited to  vaginal bleeding, contractions, leaking of fluid and fetal movement were reviewed in detail with the patient. Please refer to After Visit Summary for other counseling recommendations.  No Follow-up on file.   Levie HeritageJacob J Stinson, DO

## 2016-08-06 LAB — CERVICOVAGINAL ANCILLARY ONLY
Bacterial vaginitis: POSITIVE — AB
CANDIDA VAGINITIS: POSITIVE — AB
CHLAMYDIA, DNA PROBE: NEGATIVE
NEISSERIA GONORRHEA: NEGATIVE
Trichomonas: NEGATIVE

## 2016-08-08 ENCOUNTER — Other Ambulatory Visit: Payer: Self-pay | Admitting: Family Medicine

## 2016-08-08 MED ORDER — METRONIDAZOLE 500 MG PO TABS: 500.0000 mg | ORAL_TABLET | Freq: Two times a day (BID) | ORAL | 0 refills | Status: DC

## 2016-08-08 MED ORDER — MICONAZOLE NITRATE 2 % VA CREA: 1.0000 | TOPICAL_CREAM | Freq: Every day | VAGINAL | 2 refills | Status: DC

## 2016-08-09 ENCOUNTER — Telehealth: Payer: Self-pay | Admitting: General Practice

## 2016-08-09 LAB — CULTURE, BETA STREP (GROUP B ONLY): STREP GP B CULTURE: NEGATIVE

## 2016-08-09 NOTE — Telephone Encounter (Signed)
-----   Message from Levie HeritageJacob J Stinson, DO sent at 08/08/2016  8:58 AM EDT ----- BV and yeast - Flagyl and monistat prescribed. Please let pt know.

## 2016-08-09 NOTE — Telephone Encounter (Signed)
Called patient and informed her of results and prescriptions sent to pharmacy. Patient verbalized understanding & asked if they were sexually transmitted or if her partner needed to be treated. Told patient no they are not sexually transmitted and her partner does not require treatment. Patient verbalized understanding & had no questions

## 2016-08-22 ENCOUNTER — Telehealth (HOSPITAL_COMMUNITY): Payer: Self-pay | Admitting: *Deleted

## 2016-08-22 ENCOUNTER — Ambulatory Visit (INDEPENDENT_AMBULATORY_CARE_PROVIDER_SITE_OTHER): Payer: Medicaid Other | Admitting: Obstetrics and Gynecology

## 2016-08-22 VITALS — BP 108/60 | HR 101 | Wt 194.6 lb

## 2016-08-22 DIAGNOSIS — O34219 Maternal care for unspecified type scar from previous cesarean delivery: Secondary | ICD-10-CM

## 2016-08-22 DIAGNOSIS — O09529 Supervision of elderly multigravida, unspecified trimester: Secondary | ICD-10-CM

## 2016-08-22 DIAGNOSIS — Z98891 History of uterine scar from previous surgery: Secondary | ICD-10-CM

## 2016-08-22 DIAGNOSIS — O09523 Supervision of elderly multigravida, third trimester: Secondary | ICD-10-CM

## 2016-08-22 NOTE — Telephone Encounter (Signed)
Preadmission screen  

## 2016-08-22 NOTE — Progress Notes (Signed)
Subjective:  Anna JohnsShameela A Hodak is a 36 y.o. [id number] at 5956w4d being seen today for ongoing prenatal care.  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  Patient reports no complaints.  Contractions: Not present. Vag. Bleeding: None.  Movement: Present. Denies leaking of fluid.   The following portions of the patient's history were reviewed and updated as appropriate: allergies, current medications, past family history, past medical history, past social history, past surgical history and problem list. Problem list updated.  Objective:   Vitals:   08/22/16 1409  BP: 108/60  Pulse: (!) 101  Weight: 194 lb 9.6 oz (88.3 kg)    Fetal Status: Fetal Heart Rate (bpm): 140   Movement: Present     General:  Alert, oriented and cooperative. Patient is in no acute distress.  Skin: Skin is warm and dry. No rash noted.   Cardiovascular: Normal heart rate noted  Respiratory: Normal respiratory effort, no problems with respiration noted  Abdomen: Soft, gravid, appropriate for gestational age. Pain/Pressure: Present     Pelvic:  Cervical exam deferred        Extremities: Normal range of motion.  Edema: None  Mental Status: Normal mood and affect. Normal behavior. Normal judgment and thought content.   Urinalysis:      Assessment and Plan:  Pregnancy: G5P4005 at 6356w4d  1. Supervision of high-risk pregnancy of elderly multigravida (>= 38 years old at time of delivery) Labor precautions  2. H/O cesarean section Repeat scheduled  Term labor symptoms and general obstetric precautions including but not limited to vaginal bleeding, contractions, leaking of fluid and fetal movement were reviewed in detail with the patient. Please refer to After Visit Summary for other counseling recommendations.  Return  in about 1 week (around 08/29/2016) for OB visit.   Hermina StaggersErvin, Evian Salguero L, MD

## 2016-08-22 NOTE — Patient Instructions (Signed)
Vaginal Delivery Vaginal delivery means that you will give birth by pushing your baby out of your birth canal (vagina). A team of health care providers will help you before, during, and after vaginal delivery. Birth experiences are unique for every woman and every pregnancy, and birth experiences vary depending on where you choose to give birth. What should I do to prepare for my baby's birth? Before your baby is born, it is important to talk with your health care provider about:  Your labor and delivery preferences. These may include: ? Medicines that you may be given. ? How you will manage your pain. This might include non-medical pain relief techniques or injectable pain relief such as epidural analgesia. ? How you and your baby will be monitored during labor and delivery. ? Who may be in the labor and delivery room with you. ? Your feelings about surgical delivery of your baby (cesarean delivery, or C-section) if this becomes necessary. ? Your feelings about receiving donated blood through an IV tube (blood transfusion) if this becomes necessary.  Whether you are able: ? To take pictures or videos of the birth. ? To eat during labor and delivery. ? To move around, walk, or change positions during labor and delivery.  What to expect after your baby is born, such as: ? Whether delayed umbilical cord clamping and cutting is offered. ? Who will care for your baby right after birth. ? Medicines or tests that may be recommended for your baby. ? Whether breastfeeding is supported in your hospital or birth center. ? How long you will be in the hospital or birth center.  How any medical conditions you have may affect your baby or your labor and delivery experience.  To prepare for your baby's birth, you should also:  Attend all of your health care visits before delivery (prenatal visits) as recommended by your health care provider. This is important.  Prepare your home for your baby's  arrival. Make sure that you have: ? Diapers. ? Baby clothing. ? Feeding equipment. ? Safe sleeping arrangements for you and your baby.  Install a car seat in your vehicle. Have your car seat checked by a certified car seat installer to make sure that it is installed safely.  Think about who will help you with your new baby at home for at least the first several weeks after delivery.  What can I expect when I arrive at the birth center or hospital? Once you are in labor and have been admitted into the hospital or birth center, your health care provider may:  Review your pregnancy history and any concerns you have.  Insert an IV tube into one of your veins. This is used to give you fluids and medicines.  Check your blood pressure, pulse, temperature, and heart rate (vital signs).  Check whether your bag of water (amniotic sac) has broken (ruptured).  Talk with you about your birth plan and discuss pain control options.  Monitoring Your health care provider may monitor your contractions (uterine monitoring) and your baby's heart rate (fetal monitoring). You may need to be monitored:  Often, but not continuously (intermittently).  All the time or for long periods at a time (continuously). Continuous monitoring may be needed if: ? You are taking certain medicines, such as medicine to relieve pain or make your contractions stronger. ? You have pregnancy or labor complications.  Monitoring may be done by:  Placing a special stethoscope or a handheld monitoring device on your abdomen to   check your baby's heartbeat, and feeling your abdomen for contractions. This method of monitoring does not continuously record your baby's heartbeat or your contractions.  Placing monitors on your abdomen (external monitors) to record your baby's heartbeat and the frequency and length of contractions. You may not have to wear external monitors all the time.  Placing monitors inside of your uterus  (internal monitors) to record your baby's heartbeat and the frequency, length, and strength of your contractions. ? Your health care provider may use internal monitors if he or she needs more information about the strength of your contractions or your baby's heart rate. ? Internal monitors are put in place by passing a thin, flexible wire through your vagina and into your uterus. Depending on the type of monitor, it may remain in your uterus or on your baby's head until birth. ? Your health care provider will discuss the benefits and risks of internal monitoring with you and will ask for your permission before inserting the monitors.  Telemetry. This is a type of continuous monitoring that can be done with external or internal monitors. Instead of having to stay in bed, you are able to move around during telemetry. Ask your health care provider if telemetry is an option for you.  Physical exam Your health care provider may perform a physical exam. This may include:  Checking whether your baby is positioned: ? With the head toward your vagina (head-down). This is most common. ? With the head toward the top of your uterus (head-up or breech). If your baby is in a breech position, your health care provider may try to turn your baby to a head-down position so you can deliver vaginally. If it does not seem that your baby can be born vaginally, your provider may recommend surgery to deliver your baby. In rare cases, you may be able to deliver vaginally if your baby is head-up (breech delivery). ? Lying sideways (transverse). Babies that are lying sideways cannot be delivered vaginally.  Checking your cervix to determine: ? Whether it is thinning out (effacing). ? Whether it is opening up (dilating). ? How low your baby has moved into your birth canal.  What are the three stages of labor and delivery?  Normal labor and delivery is divided into the following three stages: Stage 1  Stage 1 is the  longest stage of labor, and it can last for hours or days. Stage 1 includes: ? Early labor. This is when contractions may be irregular, or regular and mild. Generally, early labor contractions are more than 10 minutes apart. ? Active labor. This is when contractions get longer, more regular, more frequent, and more intense. ? The transition phase. This is when contractions happen very close together, are very intense, and may last longer than during any other part of labor.  Contractions generally feel mild, infrequent, and irregular at first. They get stronger, more frequent (about every 2-3 minutes), and more regular as you progress from early labor through active labor and transition.  Many women progress through stage 1 naturally, but you may need help to continue making progress. If this happens, your health care provider may talk with you about: ? Rupturing your amniotic sac if it has not ruptured yet. ? Giving you medicine to help make your contractions stronger and more frequent.  Stage 1 ends when your cervix is completely dilated to 4 inches (10 cm) and completely effaced. This happens at the end of the transition phase. Stage 2  Once   your cervix is completely effaced and dilated to 4 inches (10 cm), you may start to feel an urge to push. It is common for the body to naturally take a rest before feeling the urge to push, especially if you received an epidural or certain other pain medicines. This rest period may last for up to 1-2 hours, depending on your unique labor experience.  During stage 2, contractions are generally less painful, because pushing helps relieve contraction pain. Instead of contraction pain, you may feel stretching and burning pain, especially when the widest part of your baby's head passes through the vaginal opening (crowning).  Your health care provider will closely monitor your pushing progress and your baby's progress through the vagina during stage 2.  Your  health care provider may massage the area of skin between your vaginal opening and anus (perineum) or apply warm compresses to your perineum. This helps it stretch as the baby's head starts to crown, which can help prevent perineal tearing. ? In some cases, an incision may be made in your perineum (episiotomy) to allow the baby to pass through the vaginal opening. An episiotomy helps to make the opening of the vagina larger to allow more room for the baby to fit through.  It is very important to breathe and focus so your health care provider can control the delivery of your baby's head. Your health care provider may have you decrease the intensity of your pushing, to help prevent perineal tearing.  After delivery of your baby's head, the shoulders and the rest of the body generally deliver very quickly and without difficulty.  Once your baby is delivered, the umbilical cord may be cut right away, or this may be delayed for 1-2 minutes, depending on your baby's health. This may vary among health care providers, hospitals, and birth centers.  If you and your baby are healthy enough, your baby may be placed on your chest or abdomen to help maintain the baby's temperature and to help you bond with each other. Some mothers and babies start breastfeeding at this time. Your health care team will dry your baby and help keep your baby warm during this time.  Your baby may need immediate care if he or she: ? Showed signs of distress during labor. ? Has a medical condition. ? Was born too early (prematurely). ? Had a bowel movement before birth (meconium). ? Shows signs of difficulty transitioning from being inside the uterus to being outside of the uterus. If you are planning to breastfeed, your health care team will help you begin a feeding. Stage 3  The third stage of labor starts immediately after the birth of your baby and ends after you deliver the placenta. The placenta is an organ that develops  during pregnancy to provide oxygen and nutrients to your baby in the womb.  Delivering the placenta may require some pushing, and you may have mild contractions. Breastfeeding can stimulate contractions to help you deliver the placenta.  After the placenta is delivered, your uterus should tighten (contract) and become firm. This helps to stop bleeding in your uterus. To help your uterus contract and to control bleeding, your health care provider may: ? Give you medicine by injection, through an IV tube, by mouth, or through your rectum (rectally). ? Massage your abdomen or perform a vaginal exam to remove any blood clots that are left in your uterus. ? Empty your bladder by placing a thin, flexible tube (catheter) into your bladder. ? Encourage   you to breastfeed your baby. After labor is over, you and your baby will be monitored closely to ensure that you are both healthy until you are ready to go home. Your health care team will teach you how to care for yourself and your baby. This information is not intended to replace advice given to you by your health care provider. Make sure you discuss any questions you have with your health care provider. Document Released: 11/07/2007 Document Revised: 08/18/2015 Document Reviewed: 02/12/2015 Elsevier Interactive Patient Education  2018 [employer]  

## 2016-08-23 ENCOUNTER — Encounter (HOSPITAL_COMMUNITY): Payer: Self-pay

## 2016-08-29 ENCOUNTER — Ambulatory Visit (INDEPENDENT_AMBULATORY_CARE_PROVIDER_SITE_OTHER): Payer: Medicaid Other | Admitting: Obstetrics and Gynecology

## 2016-08-29 VITALS — BP 121/76 | HR 76 | Wt 197.0 lb

## 2016-08-29 DIAGNOSIS — F5089 Other specified eating disorder: Secondary | ICD-10-CM

## 2016-08-29 DIAGNOSIS — Z98891 History of uterine scar from previous surgery: Secondary | ICD-10-CM

## 2016-08-29 DIAGNOSIS — O09529 Supervision of elderly multigravida, unspecified trimester: Secondary | ICD-10-CM

## 2016-08-29 DIAGNOSIS — O09523 Supervision of elderly multigravida, third trimester: Secondary | ICD-10-CM | POA: Diagnosis not present

## 2016-08-29 NOTE — Progress Notes (Signed)
Prenatal Visit Note Date: 08/29/2016 Clinic: [facility]  Subjective:  Anna James is a 36 y.o. [id number] at 304w4d being seen today for ongoing prenatal care.  She is currently monitored for the following issues for this high-risk pregnancy and has H/O cesarean section; Supervision of high-risk pregnancy of elderly multigravida (>= 38 years old at time of delivery); and Pica in adults on her problem list.  Patient reports no complaints.   Contractions: Not present. Vag. Bleeding: None.  Movement: Present. Denies leaking of fluid.   The following portions of the patient's history were reviewed and updated as appropriate: allergies, current medications, past family history, past medical history, past social history, past surgical history and problem list. Problem list updated.  Objective:   Vitals:   08/29/16 1542  BP: 121/76  Pulse: 76  Weight: 197 lb (89.4 kg)    Fetal Status: Fetal Heart Rate (bpm): 146 Fundal Height: 38 cm Movement: Present     General:  Alert, oriented and cooperative. Patient is in no acute distress.  Skin: Skin is warm and dry. No rash noted.   Cardiovascular: Normal heart rate noted  Respiratory: Normal respiratory effort, no problems with respiration noted  Abdomen: Soft, gravid, appropriate for gestational age. Pain/Pressure: Present     Pelvic:  Cervical exam deferred        Extremities: Normal range of motion.     Mental Status: Normal mood and affect. Normal behavior. Normal judgment and thought content.   Urinalysis:      Assessment and Plan:  Pregnancy: G5P4005 at 184w4d  1. Pica in adults Normal CBC at last check  2. H/O cesarean section Scheduled for 7/22. LNG IUD  3. Supervision of high-risk pregnancy of elderly multigravida (>= 38 years old at time of delivery) Routine care  Term labor symptoms and general obstetric precautions including but not limited to vaginal bleeding, contractions, leaking of fluid and fetal  movement were reviewed in detail with the patient. Please refer to After Visit Summary for other counseling recommendations.  Return in about 4 weeks (around 09/26/2016) for 4-5wk PP visit and Liletta insertion.   [place] BingPickens, Asalee Barrette, MD

## 2016-08-30 ENCOUNTER — Encounter (HOSPITAL_COMMUNITY)
Admission: RE | Admit: 2016-08-30 | Discharge: 2016-08-30 | Disposition: A | Discharge status: Home / Self Care | Payer: Medicaid Other | Source: Ambulatory Visit | Attending: Obstetrics & Gynecology | Admitting: Obstetrics & Gynecology

## 2016-08-30 LAB — CBC
HEMATOCRIT: 26.1 % — AB (ref 36.0–46.0)
HEMOGLOBIN: 8.1 g/dL — AB (ref 12.0–15.0)
MCH: 23.3 pg — ABNORMAL LOW (ref 26.0–34.0)
MCHC: 31 g/dL (ref 30.0–36.0)
MCV: 75.2 fL — ABNORMAL LOW (ref 78.0–100.0)
Platelets: 191 10*3/uL (ref 150–400)
RBC: 3.47 MIL/uL — AB (ref 3.87–5.11)
RDW: 16.9 % — ABNORMAL HIGH (ref 11.5–15.5)
WBC: 7.7 10*3/uL (ref 4.0–10.5)

## 2016-08-30 LAB — TYPE AND SCREEN
ABO/RH(D): O POS
ANTIBODY SCREEN: NEGATIVE

## 2016-08-30 NOTE — Patient Instructions (Signed)
20 Areliz A Earlene PlaterDavis  08/30/2016   Your procedure is scheduled on:  09/01/2016  [facility] at 0730 AM.  Pick up the phone at the desk and dial [phone].   Call this number if you have problems the morning of surgery: [phone]   Remember:   Do not eat food:After Midnight.  Do not drink clear liquids: After Midnight.  Take these medicines the morning of surgery with A SIP OF WATER: none   Do not wear jewelry, make-up or nail polish.  Do not wear lotions, powders, or perfumes. Do not wear deodorant.  Do not shave 48 hours prior to surgery.  Do not bring valuables to the hospital.  [facility] is not   responsible for any belongings or valuables brought to the hospital.  Contacts, dentures or bridgework may not be worn into surgery.  Leave suitcase in the car. After surgery it may be brought to your room.  For patients admitted to the hospital, checkout time is 11:00 AM the day of              discharge.   Patients discharged the day of surgery will not be allowed to drive             home.  Name and phone number of your driver: na  Special Instructions:   N/A   Please read over the following fact sheets that you were given:   Surgical Site Infection Prevention

## 2016-08-31 LAB — RPR: RPR Ser Ql: NONREACTIVE

## 2016-08-31 MED ORDER — DEXTROSE 5 % IV SOLN
3.0000 g | INTRAVENOUS | Status: DC
  Filled 2016-08-31: qty 3000

## 2016-09-01 ENCOUNTER — Inpatient Hospital Stay (HOSPITAL_COMMUNITY)
Admission: RE | Admit: 2016-09-01 | Discharge: 2016-09-03 | DRG: 766 | Disposition: A | Discharge status: Home / Self Care | Payer: Medicaid Other | Source: Ambulatory Visit | Attending: Obstetrics & Gynecology | Admitting: Obstetrics & Gynecology

## 2016-09-01 ENCOUNTER — Encounter (HOSPITAL_COMMUNITY): Payer: Self-pay | Admitting: *Deleted

## 2016-09-01 ENCOUNTER — Inpatient Hospital Stay (HOSPITAL_COMMUNITY): Payer: Medicaid Other | Admitting: Anesthesiology

## 2016-09-01 ENCOUNTER — Encounter (HOSPITAL_COMMUNITY)
Admission: RE | Disposition: A | Discharge status: Home / Self Care | Payer: Self-pay | Source: Ambulatory Visit | Attending: Obstetrics & Gynecology

## 2016-09-01 DIAGNOSIS — Z3A39 39 weeks gestation of pregnancy: Secondary | ICD-10-CM

## 2016-09-01 DIAGNOSIS — F5083 Pica in adults: Secondary | ICD-10-CM | POA: Diagnosis present

## 2016-09-01 DIAGNOSIS — O09529 Supervision of elderly multigravida, unspecified trimester: Secondary | ICD-10-CM

## 2016-09-01 DIAGNOSIS — O34211 Maternal care for low transverse scar from previous cesarean delivery: Principal | ICD-10-CM | POA: Diagnosis present

## 2016-09-01 DIAGNOSIS — Z98891 History of uterine scar from previous surgery: Secondary | ICD-10-CM

## 2016-09-01 DIAGNOSIS — F5089 Other specified eating disorder: Secondary | ICD-10-CM | POA: Diagnosis present

## 2016-09-01 SURGERY — Surgical Case
Anesthesia: Spinal

## 2016-09-01 MED ORDER — SENNOSIDES-DOCUSATE SODIUM 8.6-50 MG PO TABS
2.0000 | ORAL_TABLET | ORAL | Status: DC
  Administered 2016-09-01 – 2016-09-02 (×2): 2 via ORAL
  Filled 2016-09-01 (×2): qty 2

## 2016-09-01 MED ORDER — WITCH HAZEL-GLYCERIN EX PADS: 1.0000 "application " | MEDICATED_PAD | CUTANEOUS | Status: DC | PRN

## 2016-09-01 MED ORDER — ONDANSETRON HCL 4 MG/2ML IJ SOLN: 4.0000 mg | Freq: Once | INTRAMUSCULAR | Status: DC | PRN

## 2016-09-01 MED ORDER — FENTANYL CITRATE (PF) 100 MCG/2ML IJ SOLN
INTRAMUSCULAR | Status: DC | PRN
  Administered 2016-09-01: 50 ug via INTRAVENOUS
  Administered 2016-09-01: 30 ug via INTRAVENOUS
  Administered 2016-09-01: 20 ug via INTRATHECAL

## 2016-09-01 MED ORDER — FENTANYL CITRATE (PF) 100 MCG/2ML IJ SOLN
INTRAMUSCULAR | Status: AC
  Filled 2016-09-01: qty 2

## 2016-09-01 MED ORDER — BUPIVACAINE HCL (PF) 0.5 % IJ SOLN
INTRAMUSCULAR | Status: DC | PRN
  Administered 2016-09-01: 30 mL

## 2016-09-01 MED ORDER — DIPHENHYDRAMINE HCL 50 MG/ML IJ SOLN
INTRAMUSCULAR | Status: AC
  Filled 2016-09-01: qty 1

## 2016-09-01 MED ORDER — PHENYLEPHRINE 8 MG IN D5W 100 ML (0.08MG/ML) PREMIX OPTIME
INJECTION | INTRAVENOUS | Status: AC
  Filled 2016-09-01: qty 100

## 2016-09-01 MED ORDER — NALBUPHINE SYRINGE 5 MG/0.5 ML: 5.0000 mg | INJECTION | Freq: Once | INTRAMUSCULAR | Status: DC | PRN

## 2016-09-01 MED ORDER — BUPIVACAINE HCL (PF) 0.75 % IJ SOLN
INTRAMUSCULAR | Status: DC | PRN
  Administered 2016-09-01: 11 mg via INTRATHECAL

## 2016-09-01 MED ORDER — BUPIVACAINE HCL (PF) 0.5 % IJ SOLN
INTRAMUSCULAR | Status: AC
  Filled 2016-09-01: qty 30

## 2016-09-01 MED ORDER — SOD CITRATE-CITRIC ACID 500-334 MG/5ML PO SOLN
ORAL | Status: AC
  Filled 2016-09-01: qty 15

## 2016-09-01 MED ORDER — OXYCODONE-ACETAMINOPHEN 5-325 MG PO TABS
1.0000 | ORAL_TABLET | ORAL | Status: DC | PRN
  Administered 2016-09-03 (×2): 1 via ORAL
  Filled 2016-09-01 (×2): qty 1

## 2016-09-01 MED ORDER — MEPERIDINE HCL 25 MG/ML IJ SOLN: 6.2500 mg | INTRAMUSCULAR | Status: DC | PRN

## 2016-09-01 MED ORDER — DIPHENHYDRAMINE HCL 50 MG/ML IJ SOLN
12.5000 mg | INTRAMUSCULAR | Status: AC | PRN
  Administered 2016-09-01 (×3): 12.5 mg via INTRAVENOUS

## 2016-09-01 MED ORDER — MORPHINE SULFATE (PF) 0.5 MG/ML IJ SOLN
INTRAMUSCULAR | Status: AC
  Filled 2016-09-01: qty 10

## 2016-09-01 MED ORDER — IBUPROFEN 600 MG PO TABS
600.0000 mg | ORAL_TABLET | Freq: Four times a day (QID) | ORAL | Status: DC
  Administered 2016-09-01 – 2016-09-03 (×8): 600 mg via ORAL
  Filled 2016-09-01 (×8): qty 1

## 2016-09-01 MED ORDER — DIPHENHYDRAMINE HCL 50 MG/ML IJ SOLN
12.5000 mg | INTRAMUSCULAR | Status: DC | PRN
  Filled 2016-09-01: qty 1

## 2016-09-01 MED ORDER — SIMETHICONE 80 MG PO CHEW: 80.0000 mg | CHEWABLE_TABLET | ORAL | Status: DC | PRN

## 2016-09-01 MED ORDER — SODIUM CHLORIDE 0.9 % IR SOLN
Status: DC | PRN
  Administered 2016-09-01: 1000 mL

## 2016-09-01 MED ORDER — MENTHOL 3 MG MT LOZG: 1.0000 | LOZENGE | OROMUCOSAL | Status: DC | PRN

## 2016-09-01 MED ORDER — NALBUPHINE SYRINGE 5 MG/0.5 ML: 5.0000 mg | INJECTION | INTRAMUSCULAR | Status: DC | PRN

## 2016-09-01 MED ORDER — SCOPOLAMINE 1 MG/3DAYS TD PT72
MEDICATED_PATCH | TRANSDERMAL | Status: AC
  Filled 2016-09-01: qty 1

## 2016-09-01 MED ORDER — ONDANSETRON HCL 4 MG/2ML IJ SOLN
INTRAMUSCULAR | Status: AC
  Filled 2016-09-01: qty 2

## 2016-09-01 MED ORDER — SCOPOLAMINE 1 MG/3DAYS TD PT72
1.0000 | MEDICATED_PATCH | TRANSDERMAL | Status: DC
  Administered 2016-09-01: 1.5 mg via TRANSDERMAL

## 2016-09-01 MED ORDER — FAMOTIDINE 20 MG PO TABS
40.0000 mg | ORAL_TABLET | Freq: Once | ORAL | Status: AC
  Administered 2016-09-01: 40 mg via ORAL
  Filled 2016-09-01: qty 2

## 2016-09-01 MED ORDER — OXYCODONE-ACETAMINOPHEN 5-325 MG PO TABS: 2.0000 | ORAL_TABLET | ORAL | Status: DC | PRN

## 2016-09-01 MED ORDER — SCOPOLAMINE 1 MG/3DAYS TD PT72
1.0000 | MEDICATED_PATCH | Freq: Once | TRANSDERMAL | Status: DC
  Filled 2016-09-01: qty 1

## 2016-09-01 MED ORDER — DEXAMETHASONE SODIUM PHOSPHATE 10 MG/ML IJ SOLN
INTRAMUSCULAR | Status: AC
  Filled 2016-09-01: qty 1

## 2016-09-01 MED ORDER — LACTATED RINGERS IV SOLN
INTRAVENOUS | Status: DC | PRN
  Administered 2016-09-01: 10:00:00 via INTRAVENOUS

## 2016-09-01 MED ORDER — SIMETHICONE 80 MG PO CHEW
80.0000 mg | CHEWABLE_TABLET | Freq: Three times a day (TID) | ORAL | Status: DC
Start: 2016-09-01 — End: 2016-09-03
  Administered 2016-09-02 – 2016-09-03 (×5): 80 mg via ORAL
  Filled 2016-09-01 (×5): qty 1

## 2016-09-01 MED ORDER — OXYTOCIN 40 UNITS IN LACTATED RINGERS INFUSION - SIMPLE MED: 2.5000 [IU]/h | INTRAVENOUS | Status: AC

## 2016-09-01 MED ORDER — LACTATED RINGERS IV SOLN: INTRAVENOUS | Status: DC

## 2016-09-01 MED ORDER — KETOROLAC TROMETHAMINE 30 MG/ML IJ SOLN
30.0000 mg | Freq: Once | INTRAMUSCULAR | Status: AC
  Administered 2016-09-01: 30 mg via INTRAMUSCULAR

## 2016-09-01 MED ORDER — KETOROLAC TROMETHAMINE 30 MG/ML IJ SOLN
INTRAMUSCULAR | Status: AC
  Filled 2016-09-01: qty 1

## 2016-09-01 MED ORDER — ONDANSETRON HCL 4 MG/2ML IJ SOLN
INTRAMUSCULAR | Status: DC | PRN
  Administered 2016-09-01: 4 mg via INTRAVENOUS

## 2016-09-01 MED ORDER — PHENYLEPHRINE 8 MG IN D5W 100 ML (0.08MG/ML) PREMIX OPTIME
INJECTION | INTRAVENOUS | Status: DC | PRN
  Administered 2016-09-01: 60 ug/min via INTRAVENOUS

## 2016-09-01 MED ORDER — LACTATED RINGERS IV SOLN
INTRAVENOUS | Status: DC | PRN
  Administered 2016-09-01: 40 [IU] via INTRAVENOUS

## 2016-09-01 MED ORDER — OXYTOCIN 10 UNIT/ML IJ SOLN
INTRAMUSCULAR | Status: AC
  Filled 2016-09-01: qty 4

## 2016-09-01 MED ORDER — ONDANSETRON HCL 4 MG/2ML IJ SOLN: 4.0000 mg | Freq: Three times a day (TID) | INTRAMUSCULAR | Status: DC | PRN

## 2016-09-01 MED ORDER — FENTANYL CITRATE (PF) 100 MCG/2ML IJ SOLN
25.0000 ug | INTRAMUSCULAR | Status: DC | PRN
  Administered 2016-09-01 (×2): 50 ug via INTRAVENOUS

## 2016-09-01 MED ORDER — ACETAMINOPHEN 325 MG PO TABS: 650.0000 mg | ORAL_TABLET | ORAL | Status: DC | PRN

## 2016-09-01 MED ORDER — LACTATED RINGERS IV SOLN
INTRAVENOUS | Status: DC
  Administered 2016-09-01: 22:00:00 via INTRAVENOUS

## 2016-09-01 MED ORDER — DIBUCAINE 1 % RE OINT: 1.0000 "application " | TOPICAL_OINTMENT | RECTAL | Status: DC | PRN

## 2016-09-01 MED ORDER — COCONUT OIL OIL: 1.0000 "application " | TOPICAL_OIL | Status: DC | PRN

## 2016-09-01 MED ORDER — MEASLES, MUMPS & RUBELLA VAC ~~LOC~~ INJ
0.5000 mL | INJECTION | Freq: Once | SUBCUTANEOUS | Status: DC
  Filled 2016-09-01: qty 0.5

## 2016-09-01 MED ORDER — LACTATED RINGERS IV SOLN
INTRAVENOUS | Status: DC
  Administered 2016-09-01: 10:00:00 via INTRAVENOUS
  Administered 2016-09-01: 125 mL/h via INTRAVENOUS

## 2016-09-01 MED ORDER — SIMETHICONE 80 MG PO CHEW
80.0000 mg | CHEWABLE_TABLET | ORAL | Status: DC
  Administered 2016-09-01 – 2016-09-02 (×2): 80 mg via ORAL
  Filled 2016-09-01 (×2): qty 1

## 2016-09-01 MED ORDER — DIPHENHYDRAMINE HCL 25 MG PO CAPS
25.0000 mg | ORAL_CAPSULE | Freq: Four times a day (QID) | ORAL | Status: DC | PRN
  Administered 2016-09-02: 25 mg via ORAL
  Filled 2016-09-01: qty 1

## 2016-09-01 MED ORDER — ZOLPIDEM TARTRATE 5 MG PO TABS: 5.0000 mg | ORAL_TABLET | Freq: Every evening | ORAL | Status: DC | PRN

## 2016-09-01 MED ORDER — CEFAZOLIN SODIUM 10 G IJ SOLR
INTRAMUSCULAR | Status: DC | PRN
  Administered 2016-09-01: 3 g via INTRAVENOUS

## 2016-09-01 MED ORDER — NALOXONE HCL 2 MG/2ML IJ SOSY
1.0000 ug/kg/h | PREFILLED_SYRINGE | INTRAVENOUS | Status: DC | PRN
  Filled 2016-09-01: qty 2

## 2016-09-01 MED ORDER — TETANUS-DIPHTH-ACELL PERTUSSIS 5-2.5-18.5 LF-MCG/0.5 IM SUSP: 0.5000 mL | Freq: Once | INTRAMUSCULAR | Status: DC

## 2016-09-01 MED ORDER — METOCLOPRAMIDE HCL 10 MG PO TABS: 10.0000 mg | ORAL_TABLET | Freq: Once | ORAL | Status: DC

## 2016-09-01 MED ORDER — DIPHENHYDRAMINE HCL 25 MG PO CAPS
25.0000 mg | ORAL_CAPSULE | ORAL | Status: DC | PRN
  Administered 2016-09-02: 25 mg via ORAL
  Filled 2016-09-01: qty 1

## 2016-09-01 MED ORDER — NALOXONE HCL 0.4 MG/ML IJ SOLN: 0.4000 mg | INTRAMUSCULAR | Status: DC | PRN

## 2016-09-01 MED ORDER — BUPIVACAINE IN DEXTROSE 0.75-8.25 % IT SOLN
INTRATHECAL | Status: AC
  Filled 2016-09-01: qty 2

## 2016-09-01 MED ORDER — MORPHINE SULFATE (PF) 0.5 MG/ML IJ SOLN
INTRAMUSCULAR | Status: DC | PRN
Start: 2016-09-01 — End: 2016-09-01
  Administered 2016-09-01: .2 mg via INTRATHECAL

## 2016-09-01 MED ORDER — DEXAMETHASONE SODIUM PHOSPHATE 10 MG/ML IJ SOLN
INTRAMUSCULAR | Status: DC | PRN
  Administered 2016-09-01: 10 mg via INTRAVENOUS

## 2016-09-01 MED ORDER — SODIUM CHLORIDE 0.9% FLUSH
3.0000 mL | INTRAVENOUS | Status: DC | PRN
Start: 2016-09-01 — End: 2016-09-03

## 2016-09-01 MED ORDER — PRENATAL MULTIVITAMIN CH
1.0000 | ORAL_TABLET | Freq: Every day | ORAL | Status: DC
  Administered 2016-09-02 – 2016-09-03 (×2): 1 via ORAL
  Filled 2016-09-01 (×2): qty 1

## 2016-09-01 MED ORDER — SOD CITRATE-CITRIC ACID 500-334 MG/5ML PO SOLN
30.0000 mL | Freq: Once | ORAL | Status: AC
  Administered 2016-09-01: 30 mL via ORAL

## 2016-09-01 SURGICAL SUPPLY — 39 items
BENZOIN TINCTURE PRP APPL 2/3 (GAUZE/BANDAGES/DRESSINGS) ×3 IMPLANT
BLADE TIP J-PLASMA PRECISE LAP (MISCELLANEOUS) ×3 IMPLANT
CHLORAPREP W/TINT 26ML (MISCELLANEOUS) ×3 IMPLANT
CLAMP CORD UMBIL (MISCELLANEOUS) IMPLANT
CLOSURE WOUND 1/2 X4 (GAUZE/BANDAGES/DRESSINGS) ×1
CLOSURE WOUND 1/4X4 (GAUZE/BANDAGES/DRESSINGS) ×1
CLOTH BEACON ORANGE TIMEOUT ST (SAFETY) ×3 IMPLANT
DRSG OPSITE POSTOP 4X10 (GAUZE/BANDAGES/DRESSINGS) ×3 IMPLANT
ELECT REM PT RETURN 9FT ADLT (ELECTROSURGICAL) ×3
ELECTRODE REM PT RTRN 9FT ADLT (ELECTROSURGICAL) ×1 IMPLANT
EXTRACTOR VACUUM KIWI (MISCELLANEOUS) IMPLANT
GLOVE BIO SURGEON STRL SZ7 (GLOVE) ×6 IMPLANT
GLOVE BIOGEL PI IND STRL 7.0 (GLOVE) ×2 IMPLANT
GLOVE BIOGEL PI INDICATOR 7.0 (GLOVE) ×4
GOWN STRL REUS W/TWL LRG LVL3 (GOWN DISPOSABLE) ×6 IMPLANT
GOWN STRL REUS W/TWL XL LVL3 (GOWN DISPOSABLE) ×3 IMPLANT
KIT ABG SYR 3ML LUER SLIP (SYRINGE) IMPLANT
NEEDLE HYPO 22GX1.5 SAFETY (NEEDLE) ×3 IMPLANT
NEEDLE HYPO 25X5/8 SAFETYGLIDE (NEEDLE) IMPLANT
NS IRRIG 1000ML POUR BTL (IV SOLUTION) ×3 IMPLANT
PACK C SECTION WH (CUSTOM PROCEDURE TRAY) ×3 IMPLANT
PAD ABD 7.5X8 STRL (GAUZE/BANDAGES/DRESSINGS) ×6 IMPLANT
PAD ABD 8X10 STRL (GAUZE/BANDAGES/DRESSINGS) ×6 IMPLANT
PAD OB MATERNITY 4.3X12.25 (PERSONAL CARE ITEMS) ×3 IMPLANT
PENCIL SMOKE EVAC W/HOLSTER (ELECTROSURGICAL) ×3 IMPLANT
RTRCTR C-SECT PINK 25CM LRG (MISCELLANEOUS) IMPLANT
SPONGE SURGIFOAM ABS GEL 12-7 (HEMOSTASIS) IMPLANT
STRIP CLOSURE SKIN 1/2X4 (GAUZE/BANDAGES/DRESSINGS) ×2 IMPLANT
STRIP CLOSURE SKIN 1/4X4 (GAUZE/BANDAGES/DRESSINGS) ×2 IMPLANT
SUT PDS AB 0 CTX 60 (SUTURE) IMPLANT
SUT PLAIN 0 NONE (SUTURE) IMPLANT
SUT SILK 0 TIES 10X30 (SUTURE) IMPLANT
SUT VIC AB 0 CT1 36 (SUTURE) ×9 IMPLANT
SUT VIC AB 3-0 CT1 27 (SUTURE) ×2
SUT VIC AB 3-0 CT1 TAPERPNT 27 (SUTURE) ×1 IMPLANT
SUT VIC AB 4-0 KS 27 (SUTURE) IMPLANT
SYR CONTROL 10ML LL (SYRINGE) ×3 IMPLANT
TOWEL OR 17X24 6PK STRL BLUE (TOWEL DISPOSABLE) ×3 IMPLANT
TRAY FOLEY BAG SILVER LF 14FR (SET/KITS/TRAYS/PACK) ×3 IMPLANT

## 2016-09-01 NOTE — Anesthesia Postprocedure Evaluation (Signed)
Anesthesia Post Note  Patient: Leland JohnsShameela A Twyman  Procedure(s) Performed: Procedure(s) (LRB): REPEAT CESAREAN SECTION (N/A)     Patient location during evaluation: PACU Anesthesia Type: Spinal Level of consciousness: oriented and awake and alert Pain management: pain level controlled Vital Signs Assessment: post-procedure vital signs reviewed and stable Respiratory status: spontaneous breathing, respiratory function stable and patient connected to nasal cannula oxygen Cardiovascular status: blood pressure returned to baseline and stable Postop Assessment: no headache and no backache Anesthetic complications: no    Last Vitals:  Vitals:   09/01/16 1130 09/01/16 1145  BP: 123/64 105/76  Pulse: 92 94  Resp: 19 20  Temp:      Last Pain:  Vitals:   09/01/16 1129  TempSrc:   PainSc: 5    Pain Goal: Patients Stated Pain Goal: 5 (09/01/16 1129)               Jonathandavid Marlett

## 2016-09-01 NOTE — Brief Op Note (Signed)
09/01/2016  11:06 AM  PATIENT:  Leland JohnsShameela A Kage  36 y.o. female  PRE-OPERATIVE DIAGNOSIS:  previous cesarean section x 5  POST-OPERATIVE DIAGNOSIS:  previous cesarean section x 5  PROCEDURE:  Procedure(s): REPEAT CESAREAN SECTION (N/A)  SURGEON:  Surgeon(s) and Role:    * Willodean RosenthalHarraway-Smith, Israel Wunder, MD - Primary    *  BingPickens, Charlie, MD - Assisting  ANESTHESIA:   spinal  EBL:  Total I/O In: 2900 [I.V.:2900] Out: 900 [Urine:200; Blood:700]  BLOOD ADMINISTERED:none  DRAINS: none   LOCAL MEDICATIONS USED:  MARCAINE     SPECIMEN:  Source of Specimen:  placenta  DISPOSITION OF SPECIMEN:  PATHOLOGY  COUNTS:  YES  TOURNIQUET:  * No tourniquets in log *  DICTATION: .Note written in EPIC  PLAN OF CARE: Admit to inpatient   PATIENT DISPOSITION:  PACU - hemodynamically stable.   Delay start of Pharmacological VTE agent (>24hrs) due to surgical blood loss or risk of bleeding: yes  Complications: none immediate  Mayuri Staples L. Harraway-Smith, M.D., Evern CoreFACOG

## 2016-09-01 NOTE — Op Note (Signed)
09/01/2016  11:06 AM  PATIENT:  Anna James  36 y.o. female  PRE-OPERATIVE DIAGNOSIS:  previous cesarean section x 5  POST-OPERATIVE DIAGNOSIS:  previous cesarean section x 5  PROCEDURE:  Procedure(s): REPEAT CESAREAN SECTION (N/A)  SURGEON:  Surgeon(s) and Role:    * Willodean Rosenthal, MD - Primary    * Pringle Bing, MD - Assisting  ANESTHESIA:   spinal  EBL:  Total I/O In: 2900 [I.V.:2900] Out: 900 [Urine:200; Blood:700]  BLOOD ADMINISTERED:none  DRAINS: none   LOCAL MEDICATIONS USED:  MARCAINE     SPECIMEN:  Source of Specimen:  placenta  DISPOSITION OF SPECIMEN:  PATHOLOGY  COUNTS:  YES  TOURNIQUET:  * No tourniquets in log *  DICTATION: .Note written in EPIC  PLAN OF CARE: Admit to inpatient   PATIENT DISPOSITION:  PACU - hemodynamically stable.   Delay start of Pharmacological VTE agent (>24hrs) due to surgical blood loss or risk of bleeding: yes  Complications: none immediate  INDICATIONS: Anna James is a 36 y.o. [id number] at [redacted]w[redacted]d here for cesarean section secondary to the indications listed under preoperative diagnosis; please see preoperative note for further details.  The risks of cesarean section were discussed with the patient including but were not limited to: bleeding which may require transfusion or reoperation; infection which may require antibiotics; injury to bowel, bladder, ureters or other surrounding organs; injury to the fetus; need for additional procedures including hysterectomy in the event of a life-threatening hemorrhage; placental abnormalities wth subsequent pregnancies, incisional problems, thromboembolic phenomenon and other postoperative/anesthesia complications.   The patient concurred with the proposed plan, giving informed written consent for the procedure.    FINDINGS:  Viable female infant in cephalic presentation.  Apgars pending.  Clear amniotic fluid.  Intact placenta, three vessel cord.  Normal uterus,  fallopian tubes and ovaries bilaterally.  PROCEDURE IN DETAIL:  The patient preoperatively received intravenous antibiotics and had sequential compression devices applied to her lower extremities.  She was then taken to the operating room where spinal anesthesia was administered and was found to be adequate. She was then placed in a dorsal supine position with a leftward tilt, and prepped and draped in a sterile manner.  A foley catheter was placed into her bladder and attached to constant gravity.  After an adequate timeout was performed, a Pfannenstiel skin incision was made with scalpel and carried through to the underlying layer of fascia. The fascia was incised in the midline, and this incision was extended bilaterally using the Mayo scissors. Thee was significant adhesions of th erectus muscles to the fascia.  Kocher clamps were applied to the superior aspect of the fascial incision and the underlying rectus muscles were dissected off bluntly. A similar process was carried out on the inferior aspect of the fascial incision. The rectus muscles were separated in the midline bluntly and the peritoneum was entered bluntly.  Attention was turned to the lower uterine segment where a low transverse hysterotomy incision was made with a scalpel and extended bilaterally bluntly.  There was a small partial uterine window. And the lower uterine incision was markedly thinned out.   The infant was successfully delivered, the cord was clamped and cut and the infant was handed over to awaiting neonatology team. The placenta was delivered manually. Uterine massage was then administered.  The placenta was intact with a three-vessel cord. The uterus was then exteriorized and cleared of clot and debris.  The hysterotomy was closed with 0 Vicryl in a  running locked fashion in a single layer. Excellent hemostasis was noted.  The uterus was returned to the pelvis. The pelvis was cleared of all clot and debris. Hemostasis was  confirmed on all surfaces.  The peritoneum and the muscles were reapproximated using 0 Vicryl with 1 interrupted suture. The fascia was then closed using 0 PDS.  The skin was closed with a 4-0 Vicryl subcuticular stitch.  30 cc of 0.5% marcaine was injected into the incision and benzoin and steristrips were applied.  The patient tolerated the procedure well. Sponge, lap, instrument and needle counts were correct x 2.  She was taken to the recovery room in stable condition.   Anna James, M.D., Evern CoreFACOG

## 2016-09-01 NOTE — Addendum Note (Signed)
Addendum  created 09/01/16 1749 by Rica Recordsickelton, Dmarius Reeder, CRNA   Sign clinical note

## 2016-09-01 NOTE — Lactation Note (Signed)
This note was copied from a baby's chart. Lactation Consultation Note  P4, Baby 10 hours old and in nursery for hearing screen. Reviewed hand expression.  Drops easily expressed. Mother states she bf her other children for approx 1 week each but she did not have patience to continue. She states she is unsure how she wants to feed this child. Suggest she call LC tonight is she would like assistance. Discussed supply and demand and the importance of bf before offering formula to help establish her milk supply. Mom made aware of O/P services, breastfeeding support groups, community resources, and our phone # for post-discharge questions.     Patient Name: Girl Bonnita NasutiShameela Kashuba ZOXWR'UToday's Date: 09/01/2016 Reason for consult: Initial assessment   Maternal Data Has patient been taught Hand Expression?: Yes Does the patient have breastfeeding experience prior to this delivery?: Yes  Feeding    LATCH Score/Interventions                      Lactation Tools Discussed/Used     Consult Status Consult Status: Follow-up Date: 09/02/16 Follow-up type: In-patient    Dahlia ByesBerkelhammer, Luwanda Starr [facility] 09/01/2016, 7:58 PM

## 2016-09-01 NOTE — Transfer of Care (Signed)
Immediate Anesthesia Transfer of Care Note  Patient: Anna JohnsShameela A James  Procedure(s) Performed: Procedure(s): REPEAT CESAREAN SECTION (N/A)  Patient Location: PACU  Anesthesia Type:Spinal  Level of Consciousness: awake, alert  and oriented  Airway & Oxygen Therapy: Patient Spontanous Breathing  Post-op Assessment: Report given to RN and Post -op Vital signs reviewed and stable  Post vital signs: Reviewed and stable  Last Vitals:  Vitals:   09/01/16 0815  BP: 127/69  Pulse: 98  Resp: 16  Temp: (!) 36.3 C    Last Pain:  Vitals:   09/01/16 0815  TempSrc: Oral  PainSc: 0-No pain         Complications: No apparent anesthesia complications

## 2016-09-01 NOTE — Anesthesia Procedure Notes (Signed)
Spinal  Patient location during procedure: OR Start time: 09/01/2016 9:27 AM End time: 09/01/2016 9:28 AM Staffing Anesthesiologist: Jerrico Covello Preanesthetic Checklist Completed: patient identified, site marked, surgical consent, pre-op evaluation, timeout performed, IV checked, risks and benefits discussed and monitors and equipment checked Spinal Block Patient position: sitting Prep: DuraPrep Patient monitoring: heart rate, cardiac monitor, continuous pulse ox and blood pressure Approach: midline Location: L3-4 Injection technique: single-shot Needle Needle type: Sprotte  Needle gauge: 24 G Needle length: 9 cm Assessment Sensory level: T4

## 2016-09-01 NOTE — Anesthesia Postprocedure Evaluation (Signed)
Anesthesia Post Note  Patient: Anna JohnsShameela A James  Procedure(s) Performed: Procedure(s) (LRB): REPEAT CESAREAN SECTION (N/A)     Patient location during evaluation: Mother Baby Anesthesia Type: Spinal Level of consciousness: oriented and awake and alert Pain management: pain level controlled Vital Signs Assessment: post-procedure vital signs reviewed and stable Respiratory status: spontaneous breathing, respiratory function stable and patient connected to nasal cannula oxygen Cardiovascular status: blood pressure returned to baseline and stable Postop Assessment: no headache, no backache and patient able to bend at knees Anesthetic complications: no    Last Vitals:  Vitals:   09/01/16 1630 09/01/16 1730  BP:    Pulse:    Resp: 18 18  Temp:      Last Pain:  Vitals:   09/01/16 1530  TempSrc: Oral  PainSc: 2    Pain Goal: Patients Stated Pain Goal: 5 (09/01/16 1530)               Rica RecordsICKELTON,Crystalina Stodghill

## 2016-09-01 NOTE — H&P (Signed)
Obstetric Preoperative History and Physical  Anna James is a 36 y.o. [id number] with IUP at 2966w0d presenting for presenting for scheduled repeat cesarean section.  No acute concerns.   Prenatal Course Source of Care: [facility]  with onset of care at 21 weeks Pregnancy complications or risks: Patient Active Problem List   Diagnosis Date Noted  . 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 Supervision of high-risk pregnancy of elderly multigravida (>= 604 years old at time of delivery) 05/02/2016  . H/O cesarean section 05/11/2015   She plans to bottle feed She desires IUD for postpartum contraception.   Prenatal labs and studies: ABO, Rh: --/--/O POS (07/20 1125) Antibody: NEG (07/20 1125) Rubella: 1.89 (03/22 1406) RPR: Non Reactive (07/20 1125)  HBsAg: Negative (03/22 1406)  HIV:    GBS:  1 hr Glucola  Not done. HgbA1c WNL Genetic screening not seen Anatomy US abnormal with abnormal ventricles initially. Repeat US suspected WNL   Prenatal Transfer Tool  Maternal Diabetes: No Genetic Screening: Declined Maternal Ultrasounds/Referrals: Normal Fetal Ultrasounds or other Referrals:  None Maternal Substance Abuse:  No Significant Maternal Medications:  None Significant Maternal Lab Results: None  Past Medical History:  Diagnosis Date  . Depression    PP with 2nd, zoloft with 3rd  . Hx of varicella     Past Surgical History:  Procedure Laterality Date  . CESAREAN SECTION    . CESAREAN SECTION N/A 05/11/2015   Procedure: CESAREAN SECTION;  Surgeon: Lavina Hammanodd Meisinger, MD;  Location: [facility];  Service: Obstetrics;  Laterality: N/A;    OB History  Gravida Para Term Preterm AB Living  5 4 4     5   SAB TAB Ectopic Multiple Live Births        1 5    # Outcome Date GA Lbr Len/2nd Weight Sex Delivery Anes PTL Lv  5 Current           4 Term 05/11/15 7061w3d  8 lb 3.2 oz (3.72 kg) F CS-LTranv Spinal  LIV  3A Term 2010 563w0d  5 lb 10 oz (2.551 kg) F CS-LTranv   LIV  3B Term 2010 123w0d  4 lb 15  oz (2.24 kg) F CS-LTranv   LIV  2 Term 2009 1566w0d  7 lb 13 oz (3.544 kg) M CS-LTranv   LIV  1 Term 2008 3283w0d  9 lb 8 oz (4.309 kg) M CS-LTranv EPI  LIV      Social History   Social History  . Marital status: Single    Spouse name: N/A  . Number of children: N/A  . Years of education: N/A   Social History Main Topics  . Smoking status: Never Smoker  . Smokeless tobacco: Never Used  . Alcohol use No  . Drug use: No  . Sexual activity: Yes    Birth control/ protection: None   Other Topics Concern  . None   Social History Narrative  . None    History reviewed. No pertinent family history.  Prescriptions Prior to Admission  Medication Sig Dispense Refill Last Dose  . Prenatal MV & Min w/FA-DHA (PRENATAL ADULT GUMMY/DHA/FA PO) Take 1 tablet by mouth daily.    Taking  . metroNIDAZOLE (FLAGYL) 500 MG tablet Take 1 tablet (500 mg total) by mouth 2 (two) times daily. (Patient not taking: Reported on 08/29/2016) 14 tablet 0 Not Taking  . miconazole (MONISTAT 7) 2 % vaginal cream Place 1 Applicatorful vaginally at bedtime. Apply for seven nights (Patient not taking:  Reported on 08/29/2016) 30 g 2 Not Taking    Allergies  Allergen Reactions  . Other Shortness Of Breath, Itching and Swelling    Cats -eye swelling and itching     Review of Systems: Negative except for what is mentioned in HPI.  Physical Exam: BP 127/69 (BP Location: Left Arm)   Pulse 98   Temp (!) 97.3 F (36.3 C) (Oral)   Resp 16   Ht 5\' 5"  (1.651 m)   Wt 198 lb (89.8 kg)   LMP 12/06/2015 (Approximate)   BMI 32.95 kg/m  FHR by Doppler: + bpm GENERAL: Well-developed, well-nourished female in no acute distress.  LUNGS: Clear to auscultation bilaterally.  HEART: Regular rate and rhythm. ABDOMEN: Soft, nontender, nondistended, gravid, well-healed Pfannenstiel incision- very low. PELVIC: Deferred EXTREMITIES: Nontender, no edema, 2+ distal pulses.   Pertinent Labs/Studies:   Results for orders placed or  performed during the hospital encounter of 08/30/16 (from the past 72 hour(s))  CBC     Status: Abnormal   Collection Time: 08/30/16 11:25 AM  Result Value Ref Range   WBC 7.7 4.0 - 10.5 K/uL   RBC 3.47 (L) 3.87 - 5.11 MIL/uL   Hemoglobin 8.1 (L) 12.0 - 15.0 g/dL   HCT 16.1 (L) 09.6 - 04.5 %   MCV 75.2 (L) 78.0 - 100.0 fL   MCH 23.3 (L) 26.0 - 34.0 pg   MCHC 31.0 30.0 - 36.0 g/dL   RDW 40.9 (H) 81.1 - 91.4 %   Platelets 191 150 - 400 K/uL  Type and screen [facility] [place]     Status: None   Collection Time: 08/30/16 11:25 AM  Result Value Ref Range   ABO/RH(D) O POS    Antibody Screen NEG    Sample Expiration 09/02/2016   RPR     Status: None   Collection Time: 08/30/16 11:25 AM  Result Value Ref Range   RPR Ser Ql Non Reactive Non Reactive    Comment: (NOTE) Performed At: [facility] [address] [place], [place] [id number] Mila Homer MD YQ:[id number]     Assessment and Plan :Anna James is a 36 y.o. B2W4132 at [redacted]w[redacted]d being admitted being admitted for scheduled cesarean section. The risks of cesarean section discussed with the patient included but were not limited to: bleeding which may require transfusion or reoperation; infection which may require antibiotics; injury to bowel, bladder, ureters or other surrounding organs; injury to the fetus; need for additional procedures including hysterectomy in the event of a life-threatening hemorrhage; placental abnormalities wth subsequent pregnancies, incisional problems, thromboembolic phenomenon and other postoperative/anesthesia complications. The patient concurred with the proposed plan, giving informed written consent for the procedure. Patient has been NPO since last night she will remain NPO for procedure. Anesthesia and OR aware. Preoperative prophylactic antibiotics and SCDs ordered on call to the OR. To OR when ready.   Anna James, M.D., Evern Core

## 2016-09-01 NOTE — Anesthesia Preprocedure Evaluation (Addendum)
Anesthesia Evaluation  Patient identified by MRN, date of birth, ID band Patient awake    Reviewed: Allergy & Precautions, H&P , NPO status , Patient's Chart, lab work & pertinent test results, reviewed documented beta blocker date and time   Airway Mallampati: I  TM Distance: >3 FB Neck ROM: full    Dental no notable dental hx.    Pulmonary neg pulmonary ROS,    Pulmonary exam normal breath sounds clear to auscultation       Cardiovascular negative cardio ROS Normal cardiovascular exam Rhythm:regular Rate:Normal     Neuro/Psych negative neurological ROS  negative psych ROS   GI/Hepatic negative GI ROS, Neg liver ROS,   Endo/Other  negative endocrine ROS  Renal/GU negative Renal ROS  negative genitourinary   Musculoskeletal   Abdominal   Peds  Hematology negative hematology ROS (+)   Anesthesia Other Findings   Reproductive/Obstetrics (+) Pregnancy                             Anesthesia Physical Anesthesia Plan  ASA: II  Anesthesia Plan: Spinal   Post-op Pain Management:    Induction:   PONV Risk Score and Plan:   Airway Management Planned:   Additional Equipment:   Intra-op Plan:   Post-operative Plan:   Informed Consent: I have reviewed the patients History and Physical, chart, labs and discussed the procedure including the risks, benefits and alternatives for the proposed anesthesia with the patient or authorized representative who has indicated his/her understanding and acceptance.     Plan Discussed with:   Anesthesia Plan Comments:         Anesthesia Quick Evaluation  

## 2016-09-02 ENCOUNTER — Encounter (HOSPITAL_COMMUNITY): Payer: Self-pay | Admitting: *Deleted

## 2016-09-02 LAB — CBC
HCT: 22.3 % — ABNORMAL LOW (ref 36.0–46.0)
HEMOGLOBIN: 7.1 g/dL — AB (ref 12.0–15.0)
MCH: 23.8 pg — ABNORMAL LOW (ref 26.0–34.0)
MCHC: 31.8 g/dL (ref 30.0–36.0)
MCV: 74.8 fL — ABNORMAL LOW (ref 78.0–100.0)
Platelets: 199 10*3/uL (ref 150–400)
RBC: 2.98 MIL/uL — ABNORMAL LOW (ref 3.87–5.11)
RDW: 16.6 % — AB (ref 11.5–15.5)
WBC: 13.6 10*3/uL — AB (ref 4.0–10.5)

## 2016-09-02 MED ORDER — FERROUS FUMARATE 324 (106 FE) MG PO TABS
1.0000 | ORAL_TABLET | Freq: Two times a day (BID) | ORAL | Status: DC
  Administered 2016-09-02 – 2016-09-03 (×2): 106 mg via ORAL
  Filled 2016-09-02 (×5): qty 1

## 2016-09-02 NOTE — Progress Notes (Signed)
Psychosocial assessment completed.  Full documentation to follow.  CSW identifies no barriers to discharge when MOB and baby are medically ready. 

## 2016-09-02 NOTE — Progress Notes (Signed)
Subjective: Postpartum Day #1: Cesarean Delivery Patient reports tolerating PO and no problems voiding.  Denies dizziness with ambulation; breast and bottlefeeding; desires Mirena for pp contraception.  Objective: Vital signs in last 24 hours: Temp:  [97.5 F (36.4 C)-99 F (37.2 C)] 97.9 F (36.6 C) (07/23 0730) Pulse Rate:  [63-94] 72 (07/23 0730) Resp:  [13-21] 18 (07/23 0730) BP: (98-123)/(50-78) 107/59 (07/23 0730) SpO2:  [96 %-100 %] 98 % (07/23 0323)  Physical Exam:  General: alert, cooperative and no distress Lochia: appropriate Uterine Fundus: firm Incision: pressure dsg intact, dry DVT Evaluation: No evidence of DVT seen on physical exam.   Recent Labs  08/30/16 1125 09/02/16 0523  HGB 8.1* 7.1*  HCT 26.1* 22.3*    Assessment/Plan: Status post Cesarean section. Doing well postoperatively.  Continue current care. Start on bid iron. Discussed bld tx, but pt declines currently. Anticipate d/c 09/03/16.  Cam HaiSHAW, KIMBERLY CNM 09/02/2016, 8:35 AM

## 2016-09-03 MED ORDER — IBUPROFEN 600 MG PO TABS: 600.0000 mg | ORAL_TABLET | Freq: Four times a day (QID) | ORAL | 0 refills | Status: DC

## 2016-09-03 MED ORDER — SENNOSIDES-DOCUSATE SODIUM 8.6-50 MG PO TABS: 1.0000 | ORAL_TABLET | Freq: Every evening | ORAL | 0 refills | Status: DC | PRN

## 2016-09-03 MED ORDER — OXYCODONE-ACETAMINOPHEN 5-325 MG PO TABS: 1.0000 | ORAL_TABLET | ORAL | 0 refills | Status: DC | PRN

## 2016-09-03 NOTE — Progress Notes (Signed)
POSTPARTUM PROGRESS NOTE  Post op Day 2 Subjective:  Anna JohnsShameela A Paladino is a 36 y.o. [id number] 125w0d s/p C section.  No acute events overnight.  Pt denies problems with ambulating, voiding or po intake. She denies light headedness or issues with orthostatics.  She denies nausea or vomiting.  Pain is moderately controlled she reports 8/10.  She has had flatus. She has not had bowel movement.  Lochia Moderate.   Objective: Blood pressure (!) 107/50, pulse 66, temperature 98 F (36.7 C), temperature source Oral, resp. rate 18, height 5\' 5"  (1.651 m), weight 89.8 kg (198 lb), last menstrual period 12/06/2015, SpO2 98 %, unknown if currently breastfeeding.  Physical Exam:  General: alert, cooperative and no distress Lochia:normal flow Chest: CTAB Heart: RRR no m/r/g Abdomen: +BS, soft, nontender,  DVT Evaluation: No calf swelling or tenderness   Recent Labs  09/02/16 0523  HGB 7.1*  HCT 22.3*    Assessment/Plan:  ASSESSMENT: Anna James is a 36 y.o. A5W0981G5P5006 385w0d 2 days  s/p C section   Plan for discharge tomorrow, given lack of symptoms she likely will not need RBC's.    LOS: 2 days   Sullivan LoneBrannon L Inman, Medical Student   OB FELLOW MEDICAL STUDENT NOTE ATTESTATION  I confirm that I have verified the information documented in the medical student's note and that I have also personally performed the physical exam and all medical decision making activities.  Pressure dressing removed. Honeycomb dressing with mild serosanguinous saturation Planning on IUD for contraception.   Frederik PearJulie P Olvin Rohr, MD OB Fellow 09/03/2016, 10:12 AM

## 2016-09-03 NOTE — Discharge Instructions (Signed)

## 2016-09-03 NOTE — Discharge Summary (Signed)
OB Discharge Summary     Patient Name: Anna JohnsShameela A Pomales DOB: April 20, 1980 MRN: [id number]  Date of admission: 09/01/2016 Delivering MD: Willodean RosenthalHARRAWAY-SMITH, CAROLYN   Date of discharge: 09/03/2016  Admitting diagnosis: RCS Intrauterine pregnancy: 5440w0d     Secondary diagnosis:  Principal Problem:   H/O cesarean section Active Problems:   Supervision of high-risk pregnancy of elderly multigravida (>= 38 years old at time of delivery)   Pica in adults   Previous cesarean section  Additional problems:  Patient Active Problem List   Diagnosis Date Noted  . Previous cesarean section 09/01/2016  . 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 Supervision of high-risk pregnancy of elderly multigravida (>= 38 years old at time of delivery) 05/02/2016  . H/O cesarean section 05/11/2015        Discharge diagnosis: Term Pregnancy Delivered                                                                                                Post partum procedures:none  Augmentation: none  Complications: None  Hospital course:  Sceduled C/S   36 y.o. yo G5P5006 at 2640w0d was admitted to the hospital 09/01/2016 for scheduled cesarean section with the following indication:Elective Repeat.  Membrane Rupture Time/Date: 9:56 AM ,09/01/2016   Patient delivered a Viable infant.09/01/2016  Details of operation can be found in separate operative note.  Pateint had an uncomplicated postpartum course.  She is ambulating, tolerating a regular diet, passing flatus, and urinating well. Patient is discharged home in stable condition on  09/03/16         Physical exam  Vitals:   09/02/16 0323 09/02/16 0730 09/02/16 1931 09/03/16 0545  BP: (!) 98/53 (!) 107/59 112/65 (!) 107/50  Pulse: 76 72 81 66  Resp: 18 18 18 18   Temp: 98.4 F (36.9 C) 97.9 F (36.6 C) 97.9 F (36.6 C) 98 F (36.7 C)  TempSrc: Oral Oral Axillary Oral  SpO2: 98%     Weight:      Height:       Refer to Progress Note from this Am 7/24  Labs: Lab  Results  Component Value Date   WBC 13.6 (H) 09/02/2016   HGB 7.1 (L) 09/02/2016   HCT 22.3 (L) 09/02/2016   MCV 74.8 (L) 09/02/2016   PLT 199 09/02/2016   CMP Latest Ref Rng & Units 11/09/2008  Glucose 70 - 99 mg/dL 88  BUN 6 - 23 mg/dL 4(L)  Creatinine 0.4 - 1.2 mg/dL 4.090.70  Sodium 811135 - 914145 mEq/L 135  Potassium 3.5 - 5.1 mEq/L 3.8  Chloride 96 - 112 mEq/L 104  CO2 19 - 32 mEq/L 24  Calcium 8.4 - 10.5 mg/dL 8.9  Total Protein 6.0 - 8.3 g/dL 6.7  Total Bilirubin 0.3 - 1.2 mg/dL 0.6  Alkaline Phos 39 - 117 U/L 91  AST 0 - 37 U/L 17  ALT 0 - 35 U/L 10    Discharge instruction: per After Visit Summary and "Baby and Me Booklet".  After visit meds:  Allergies as of 09/03/2016      Reactions   Other  Shortness Of Breath, Itching, Swelling   Cats -eye swelling and itching       Medication List    STOP taking these medications   metroNIDAZOLE 500 MG tablet Commonly known as:  FLAGYL   miconazole 2 % vaginal cream Commonly known as:  MONISTAT 7     TAKE these medications   ibuprofen 600 MG tablet Commonly known as:  ADVIL,MOTRIN Take 1 tablet (600 mg total) by mouth every 6 (six) hours.   oxyCODONE-acetaminophen 5-325 MG tablet Commonly known as:  PERCOCET/ROXICET Take 1 tablet by mouth every 4 (four) hours as needed (pain scale 4-7).   PRENATAL ADULT GUMMY/DHA/FA PO Take 1 tablet by mouth daily.   senna-docusate 8.6-50 MG tablet Commonly known as:  Senokot-S Take 1 tablet by mouth at bedtime as needed for mild constipation.       Diet: routine diet  Activity: Advance as tolerated. Pelvic rest for 6 weeks.   Outpatient follow up:4 weeks Follow up Appt:Future Appointments Date Time Provider Department Center  09/30/2016 2:00 PM Marny Lowenstein, [place] [facility]   Follow up Visit:No Follow-up on file.  Postpartum contraception: IUD Mirena  Newborn Data: Live born female  Birth Weight: 8 lb 11.3 oz (3950 g) APGAR: 8, 9  Baby Feeding:  Breast Disposition:home with mother   09/03/2016 [place] Shirley, DO   OB FELLOW DISCHARGE ATTESTATION  I have seen and examined this patient and agree with above documentation in the resident's note.   Frederik Pear, MD OB Fellow 4:21 PM

## 2016-09-06 NOTE — Clinical Social Work Maternal (Signed)
CLINICAL SOCIAL WORK MATERNAL/CHILD NOTE  Patient Details  Name: Anna James MRN: [id number] Date of Birth: [birth year]  Date:  09/02/2016  Clinical Social Worker Initiating Note:  Terri Piedra, Alex Date/ Time Initiated:  09/02/16/1500     Child's Name:  Dawayne Cirri   Legal Guardian:  Other (Comment) Inda Castle and Celesta Aver)   Need for Interpreter:  None   Date of Referral:  09/02/16     Reason for Referral:  Other (Comment) (Hx of PDD.  HX of CPS involvement.)   Referral Source:  [facility]   Address:  [address], [place], [place] [address]  Phone number:  [phone]   Household Members:  Significant Other, Minor Children   Natural Supports (not living in the home):  Parent, Immediate Family (MOB states her sister and mother are her greatest supports in addition to FOB.)   Professional Supports: Other (Comment) Heritage manager at [employer])   Employment:     Type of Work:     Education:      Pensions consultant:  [employer]   Other Resources:  [employer], Physicist, medical    Cultural/Religious Considerations Which May Impact Care: None stated.  Strengths:  Ability to meet basic needs , Home prepared for child , Pediatrician chosen    Risk Factors/Current Problems:  Mental Health Concerns  (hx of Depression)   Cognitive State:  Able to Concentrate , Alert , Insightful , Goal Oriented , Linear Thinking    Mood/Affect:  Calm , Comfortable , Interested    CSW Assessment: CSW met with MOB to offer support and complete assessment due to hx of PPD.  CSW notes that this CSW met with MOB after the birth of her baby in 2017 where CPS was involved due to hx of foster care involvement and it was determined that her baby could be discharged into her care with supervision from her mother.  CSW spoke with [facility] CPS to inquire about current involvement.  CSW confirmed that MOB has no open case at this time and has not lost custody of her  child born in 2017. MOB was quiet, but pleasant and welcoming of CSW's visit.  Assessment was somewhat difficult due to staff assessing baby while CSW was in the room.  However, MOB was attentive and communicative with CSW.  She acknowledges previous CPS involvement and is thankful that they are no longer involved and that things are different in her life at this time.  She reports that FOB, her sister, and her mother are her greatest natural supports, and that she receives mental health services from the [facility].  She states she was taking Zoloft prior to pregnancy and plans to resume this medication as soon as possible.  She states plans to follow up with her provider at [facility] to make an appointment as soon as she is feeling healed from surgery and reports that it does not take long to get in for an appointment since she is an established patient.  She states she does not have a counselor, but is not interested in counseling at this time and does not feel it is needed.  MOB was easy to engage in order to review PMADs/Baby Blues.  CSW provided MOB with information about support groups held at [facility] and encouraged her to self evaluate with the New Mom Checklist from Postpartum Progress.  MOB agreed.   MOB states she has all necessary supplies for infant at home and is aware of  SIDS precautions as reviewed by CSW.   MOB does not wish to have any more children, but states she was nervous about BTL.  She states she has already spoken with her OB about getting a Mirena IUD.    CSW Plan/Description:  No Further Intervention Required/No Barriers to Discharge, Patient/Family Education     Alphonzo Cruise, [place] 09/02/2016, 4:05 PM

## 2016-09-17 ENCOUNTER — Ambulatory Visit: Payer: Medicaid Other

## 2016-09-17 ENCOUNTER — Ambulatory Visit: Payer: Medicaid Other | Admitting: General Practice

## 2016-09-17 VITALS — BP 113/70 | HR 67 | Wt 175.0 lb

## 2016-09-17 DIAGNOSIS — Z5189 Encounter for other specified aftercare: Secondary | ICD-10-CM

## 2016-09-17 NOTE — Progress Notes (Signed)
Patient here for wound check today. Patient delivered by C-Section on 7/22. Patient states she started to notice an odor over the weekend that is terrible. Patient states she has been trying to keep the area clean. Upon assessment, patient still has honeycomb dressing in place & appears wet underneath. Removed dressing. Steri strips were mostly off and wet. Area smelled musty/dirty. Cleaned incision multiple times with sterile saline and gauze. Odor significantly improved. Incision now clean dry & intact. 1 cm midline & 2cm left side granulation tissue noted. Incision appears to be healing well. Instructed patient in incision cleaning practices & signs and symptoms of infection. Patient verbalized understanding to all & had no questions.

## 2016-09-19 ENCOUNTER — Ambulatory Visit (INDEPENDENT_AMBULATORY_CARE_PROVIDER_SITE_OTHER): Payer: Medicaid Other | Admitting: Advanced Practice Midwife

## 2016-09-19 DIAGNOSIS — Z9889 Other specified postprocedural states: Secondary | ICD-10-CM

## 2016-09-19 DIAGNOSIS — L929 Granulomatous disorder of the skin and subcutaneous tissue, unspecified: Secondary | ICD-10-CM

## 2016-09-19 NOTE — Patient Instructions (Signed)
How to Change Your Dressing A dressing is a material that is placed in and over wounds. A dressing helps your wound to heal by protecting it from:  Bacteria.  Worse injury.  Being too dry or too wet.  What are the risks? The sticky (adhesive) tape that is used with a dressing may make your skin sore or irritated, or it may cause a rash. These are the most common problems. However, more serious problems can develop, such as:  Bleeding.  Infection.  How to change your dressing Getting Ready to Change Your Dressing   Take a shower before you do the first dressing change of the day. If your doctor does not want your wound to get wet and your dressing is not waterproof, you may need to put plastic leak-proof sealing wrap on your dressing to protect it.  If needed, take pain medicine as told by your doctor 30 minutes before you change your dressing.  Set up a clean station for wound care. You will need: ? A plastic trash bag that is open and ready to use. ? Hand sanitizer. ? Wound cleanser or salt-water solution (saline) as told by your doctor. ? New dressing material or bandages. Make sure to open the dressing package so the dressing stays on the inside of the package. You may also need these supplies in your clean station:  A box of vinyl gloves.  Tape.  Skin protectant. This may be a wipe, film, or spray.  Clean or germ-free (sterile) scissors.  A cotton-tipped applicator.  Taking Off Your Old Dressing  Wash your hands with soap and water. Dry your hands with a clean towel. If you cannot use soap and water, use hand sanitizer.  If you are using gloves, put on the gloves before you take off the dressing.  Gently take off any adhesive or tape by pulling it off in the direction of your hair growth. Only touch the outside edges of the dressing.  Take off the dressing. If the dressing sticks to your skin, wet the dressing with a germ-free salt-water solution. This helps it  come off more easily.  Take off any gauze or packing in your wound.  Throw the old dressing supplies into the ready trash bag.  Take off your gloves. To take off each glove, grab the cuff with your other hand and turn the glove inside out. Put the gloves in the trash right away.  Wash your hands with soap and water. Dry your hands with a clean towel. If you cannot use soap and water, use hand sanitizer. Cleaning Your Wound  Follow instructions from your doctor about how to clean your wound. This may include using a salt-water solution or recommended wound cleanser.  Do not use over-the-counter medicated or antiseptic creams, sprays, liquids, or dressings unless your doctor tells you to do that.  Use a clean gauze pad to clean the area fully with the salt-water solution or wound cleanser that your doctor recommends.  Throw the gauze pad into the trash bag.  Wash your hands with soap and water. Dry your hands with a clean towel. If you cannot use soap and water, use hand sanitizer. Putting on the Dressing  If your doctor recommended a skin protectant, put it on the skin around the wound.  Cover the wound with the recommended dressing, such as a nonstick gauze or bandage. Make sure to touch only the outside edges of the dressing. Do not touch the inside of the dressing.    Attach the dressing so all sides stay in place. You may do this with the attached medical adhesive, roll gauze, or tape. If you use tape, do not wrap the tape all the way around your arm or leg.  Take off your gloves. Put them in the trash bag with the old dressing. Tie the bag shut and throw it away.  Wash your hands with soap and water. Dry your hands with a clean towel. If you cannot use soap and water, use hand sanitizer. Get help if:   You have new pain.  You have irritation, a rash, or itching around the wound or dressing.  Changing your dressing is painful.  Changing your dressing causes a lot of  bleeding. Get help right away if:  You have very bad pain.  You have signs of infection, such as: ? More redness, swelling, or pain. ? More fluid or blood. ? Warmth. ? Pus or a bad smell. ? Red streaks leading from wound. ? A fever. This information is not intended to replace advice given to you by your health care provider. Make sure you discuss any questions you have with your health care provider. Document Released: 04/26/2008 Document Revised: 07/06/2015 Document Reviewed: 11/03/2014 Elsevier Interactive Patient Education  2018 [employer]  

## 2016-09-19 NOTE — Progress Notes (Signed)
Pt reports continued concerns of drainage from C/S incision and odor. She states she is performing cleaning and care as instructed on 8/7. Upon assessment, small amount of purulent drainage and odor was observed @ both areas of reddened granulation tissue. Incision otherwise well-healed. Marylynn Pearsonarrie Hillman, RN and Wynelle BourgeoisMarie Williams, CNM in to room to assess wound.

## 2016-09-19 NOTE — Progress Notes (Signed)
   PRENATAL VISIT NOTE  Subjective:  Anna James is a 36 y.o. G5P5006 at 2 wks postop c/s being seen today for wound check.  She is currently monitored for the following issues for this low-risk pregnancy and has H/O cesarean section; Supervision of high-risk pregnancy of elderly multigravida (>= 10163 years old at time of delivery); Pica in adults; and Previous cesarean section on her problem list.  Patient reports wound leaking and opening.   .  .   . Denies leaking of fluid.   The following portions of the patient's history were reviewed and updated as appropriate: allergies, current medications, past family history, past medical history, past social history, past surgical history and problem list. Problem list updated.  Objective:  There were no vitals filed for this visit.  Fetal Status:           General:  Alert, oriented and cooperative. Patient is in no acute distress.  Skin: Skin is warm and dry. No rash noted.   Cardiovascular: Normal heart rate noted  Respiratory: Normal respiratory effort, no problems with respiration noted  Abdomen: Soft, gravid, appropriate for gestational age.        Pelvic: Cervical exam deferred        Extremities: Normal range of motion.     Mental Status:  Normal mood and affect. Normal behavior. Normal judgment and thought content.   Wound is well approximated. There are several areas of granuation tissue noted Some white drainage Not able to admit swab, fascia intact    Silver nitrate applied after photos taken.  Whitening of tissue occurred with application Some friability noted after cleansing. Cleaned with peroxide and dressing applied   Assessment and Plan:  Pregnancy: [id number]                       Postop Cesarean Section                      Granulation tissue at wound  Directed to clean in shower with soap and water Pat dry Apply dry gauze to absorb fluid If becomes "goopy", may use peroxide to clean wound. Return in one  week  Please refer to After Visit Summary for other counseling recommendations.  No Follow-up on file.   Wynelle BourgeoisMarie Jiovani Mccammon, CNM

## 2016-09-26 ENCOUNTER — Ambulatory Visit: Payer: Medicaid Other

## 2016-09-30 ENCOUNTER — Ambulatory Visit (INDEPENDENT_AMBULATORY_CARE_PROVIDER_SITE_OTHER): Payer: Medicaid Other | Admitting: Medical

## 2016-09-30 ENCOUNTER — Encounter: Payer: Self-pay | Admitting: Medical

## 2016-09-30 DIAGNOSIS — Z3201 Encounter for pregnancy test, result positive: Secondary | ICD-10-CM | POA: Diagnosis not present

## 2016-09-30 DIAGNOSIS — Z98891 History of uterine scar from previous surgery: Secondary | ICD-10-CM

## 2016-09-30 LAB — POCT PREGNANCY, URINE
PREG TEST UR: POSITIVE — AB
PREG TEST UR: POSITIVE — AB

## 2016-09-30 NOTE — Progress Notes (Signed)
Subjective:     Anna James is a 36 y.o. female who presents for a postpartum visit. She is 4 weeks postpartum following a low cervical transverse Cesarean section. I have fully reviewed the prenatal and intrapartum course. The delivery was at 39 gestational weeks. Outcome: repeat cesarean section, low transverse incision. Anesthesia: spinal. Postpartum course has been unremarkable. Baby's course has been unremarkable. Baby is feeding by bottle - Similac Advance. Bleeding staining only. Bowel function is normal. Bladder function is normal. Patient is not sexually active. Contraception method is none. Postpartum depression screening: negative.  The following portions of the patient's history were reviewed and updated as appropriate: allergies, current medications, past family history, past medical history, past social history, past surgical history and problem list.  Review of Systems Pertinent items are noted in HPI.   Objective:    BP 115/75   Pulse 75   Breastfeeding? No   General:  alert and cooperative   Breasts:  not evaluated  Lungs: clear to auscultation bilaterally  Heart:  regular rate and rhythm, S1, S2 normal, no murmur, click, rub or gallop  Abdomen: soft, non-tender; bowel sounds normal; no masses,  no organomegaly 2 small areas of granular tissue noted along the incision site   Vulva:  not evaluated  Vagina: not evaluated  Cervix:  not evaluated  Corpus: not examined  Adnexa:  not evaluated  Rectal Exam: Not performed.         MDM Discussed patient with Dr. Vergie Living, who has evaluated the patient's incision. Recommends use of silver nitrate after lidocaine for local anesthesia.   1% lidocaine injected into the area just above and below the areas of granular tissue noted at the incision site. Silver nitrate used on both area. Patient tolerated well. Advised to keep area clean and dry.  Assessment:     Postpartum exam. Pap smear not done at today's visit.  Positive  pregnancy test, possible residual hormone  Granular tissue of the C/S incision site  Plan:    1. Contraception: abstinence 2. Quant hCG drawn today, will follow-up result and schedule IUD insertion when negative  3. Follow up in: 10 days for repeat UPT, possible IUD insertion and re-evaluation of C/S incision or sooner as needed.    Marny Lowenstein, PA-C 09/30/16 3:45 PM

## 2016-09-30 NOTE — Patient Instructions (Addendum)

## 2016-10-01 LAB — BETA HCG QUANT (REF LAB): HCG QUANT: 2 m[IU]/mL

## 2016-10-02 ENCOUNTER — Telehealth: Payer: Self-pay | Admitting: General Practice

## 2016-10-02 NOTE — Telephone Encounter (Signed)
-----   Message from Marny Lowenstein, PA-C sent at 10/02/2016 11:39 AM EDT ----- Please call patient and inform her that hCG was negative so she can get IUD placed at visit already scheduled for 8/29, but no unprotected intercourse before that visit.   Raynelle Fanning

## 2016-10-02 NOTE — Telephone Encounter (Signed)
Called patient, no answer- left message stating we are trying to reach you with results, please call us back. 

## 2016-10-09 ENCOUNTER — Ambulatory Visit: Payer: Medicaid Other | Admitting: Advanced Practice Midwife

## 2016-10-11 NOTE — Telephone Encounter (Signed)
Called patient, no answer- left message stating we are trying to reach you with results please call us back. Will send letter. 

## 2016-10-28 ENCOUNTER — Ambulatory Visit: Payer: Medicaid Other | Admitting: Medical

## 2016-10-28 ENCOUNTER — Ambulatory Visit: Payer: Self-pay | Admitting: Obstetrics & Gynecology

## 2016-11-29 ENCOUNTER — Ambulatory Visit: Payer: Self-pay | Admitting: Obstetrics and Gynecology

## 2016-12-02 ENCOUNTER — Ambulatory Visit: Payer: Medicaid Other | Admitting: Obstetrics & Gynecology

## 2016-12-02 ENCOUNTER — Encounter: Payer: Self-pay | Admitting: *Deleted

## 2016-12-02 DIAGNOSIS — Z30014 Encounter for initial prescription of intrauterine contraceptive device: Secondary | ICD-10-CM

## 2016-12-02 LAB — POCT PREGNANCY, URINE: PREG TEST UR: NEGATIVE

## 2016-12-02 NOTE — Progress Notes (Signed)
   Subjective:    Patient ID: Leland JohnsShameela A Stclair, female    DOB: Apr 20, 1980, 36 y.o.   MRN: [id number]  HPI  She was here for IUD insertion, however she has had unprotected IC recently. She reports that her incision is doing well.  Review of Systems     Objective:   Physical Exam        Assessment & Plan:  Desire for IUD- She plans to come back in 2 weeks after NO unprotected intercourse.

## 2016-12-02 NOTE — Progress Notes (Signed)
Per Dr.Dove, pt does not need to be contacted in regards to missed appt.  Pt can call at her discretion.

## 2016-12-18 ENCOUNTER — Ambulatory Visit (INDEPENDENT_AMBULATORY_CARE_PROVIDER_SITE_OTHER): Payer: Medicaid Other | Admitting: Obstetrics and Gynecology

## 2016-12-18 ENCOUNTER — Encounter: Payer: Self-pay | Admitting: Obstetrics and Gynecology

## 2016-12-18 ENCOUNTER — Other Ambulatory Visit (HOSPITAL_COMMUNITY)
Admission: RE | Admit: 2016-12-18 | Discharge: 2016-12-18 | Disposition: A | Discharge status: Home / Self Care | Payer: Medicaid Other | Source: Ambulatory Visit | Attending: Obstetrics and Gynecology | Admitting: Obstetrics and Gynecology

## 2016-12-18 DIAGNOSIS — Z30433 Encounter for removal and reinsertion of intrauterine contraceptive device: Secondary | ICD-10-CM | POA: Diagnosis present

## 2016-12-18 DIAGNOSIS — Z124 Encounter for screening for malignant neoplasm of cervix: Secondary | ICD-10-CM

## 2016-12-18 DIAGNOSIS — A5901 Trichomonal vulvovaginitis: Secondary | ICD-10-CM | POA: Insufficient documentation

## 2016-12-18 DIAGNOSIS — Z3202 Encounter for pregnancy test, result negative: Secondary | ICD-10-CM | POA: Diagnosis not present

## 2016-12-18 DIAGNOSIS — Z975 Presence of (intrauterine) contraceptive device: Secondary | ICD-10-CM

## 2016-12-18 LAB — POCT PREGNANCY, URINE: Preg Test, Ur: NEGATIVE

## 2016-12-18 MED ORDER — LEVONORGESTREL 18.6 MCG/DAY IU IUD
INTRAUTERINE_SYSTEM | Freq: Once | INTRAUTERINE | Status: AC
  Administered 2016-12-18: 14:00:00 1 via INTRAUTERINE

## 2016-12-18 NOTE — Addendum Note (Signed)
Addended by: Faythe CasaBELLAMY, JEANETTA M on: 12/18/2016 02:26 PM   Modules accepted: Orders

## 2016-12-18 NOTE — Progress Notes (Signed)
    IUD INSERTION PROCEDURE NOTE  Anna James is a 36 y.o. [id number] here for Liletta insertion. No GYN concerns.   She was counseled regarding the risks/benefits of IUD including insertion risk of infection, hemorrhage, damage to surrounding tissue and organs, uterine perforation. She was counseled regarding risks of IUD including implantation into uterine wall, migration outside of uterus, possible need for hysteroscopic or laparoscopic removal, ovarian cysts, expulsion. She was advised that risk of pregnancy is low with negative UPT but is not zero and IUD insertion may cause miscarriage. Reviewed that she is also at slightly higher risk for ectopic pregnancy and she should take a pregnancy test if she believes she may be pregnant. She was advised to use backup method of protection for one week. She verbalized understanding of all of the above and consent signed.   Last intercourse was 2-3 weeks ago and protected Last pap smear: done today UPT today: negative  IUD Removal and Reinsertion  Patient identified and an adequate time out was performed. Speculum placed in the vagina. A pap smear was obtained. The cervix was cleaned with Betadine x 2 and grasped anteriorly with a single tooth tenaculum.  The new Liletta IUD insertion apparatus was used to sound the uterus to 8 cm;  the IUD was then placed per manufacturer's recommendations. Strings trimmed to 3 cm. Tenaculum was removed, small amount of bleeding at tenaculum site, improved with silver nitrate and stopped completely with monsel's solution. Hemostasis noted at end of procedure. Patient tolerated procedure well.   Patient was given post-procedure instructions.  She was reminded to have backup contraception for one week during this transition period between IUDs.  Patient was also asked to check IUD strings periodically and follow up in 4 weeks for IUD check.    Baldemar LenisK. Meryl Roppolo, M.D. Attending Obstetrician & Gynecologist, [facility]  [facility] for [facility], [facility]

## 2016-12-20 LAB — CYTOLOGY - PAP
DIAGNOSIS: NEGATIVE
HPV (WINDOPATH): NOT DETECTED

## 2017-02-25 ENCOUNTER — Telehealth: Payer: Self-pay | Admitting: *Deleted

## 2017-02-25 NOTE — Telephone Encounter (Addendum)
Called pt and left message that I am calling in reference to test results from her office visit in November. Please call back and leave a message of when is a good time to reach you.  **Pt needs to be informed that upon checking past lab results, an abnormal test results has been identified. She had +Trich on her Pap which was otherwise normal on 12/18/16. She needs medication treatment per guideline and her partner needs treatment also.   1/21  1910  Called pt and left VM stating that I am calling with test result information. I requested pt to call back and leave a message stating whether a detailed message can be left on her VM.  Certified letter sent to pt.

## 2017-03-03 ENCOUNTER — Encounter: Payer: Self-pay | Admitting: *Deleted

## 2017-03-03 MED ORDER — METRONIDAZOLE 500 MG PO TABS: ORAL_TABLET | ORAL | 0 refills | Status: DC

## 2017-06-17 ENCOUNTER — Encounter: Payer: Self-pay | Admitting: *Deleted

## 2018-01-16 IMAGING — US US MFM OB DETAIL+14 WK
1 series · 13 of 28 positions shown · non-contrast
Comparison: none

[Series 1: us mfm ob detail+14 wk · 13 of 90 slices shown]
[im 4/90]
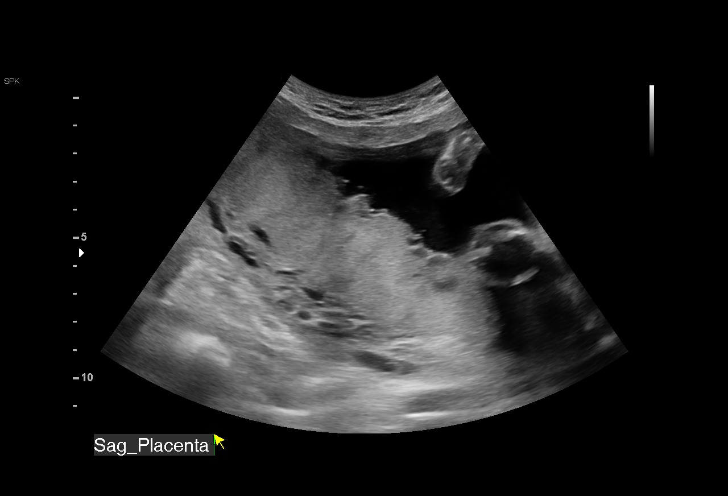
[im 10/90]
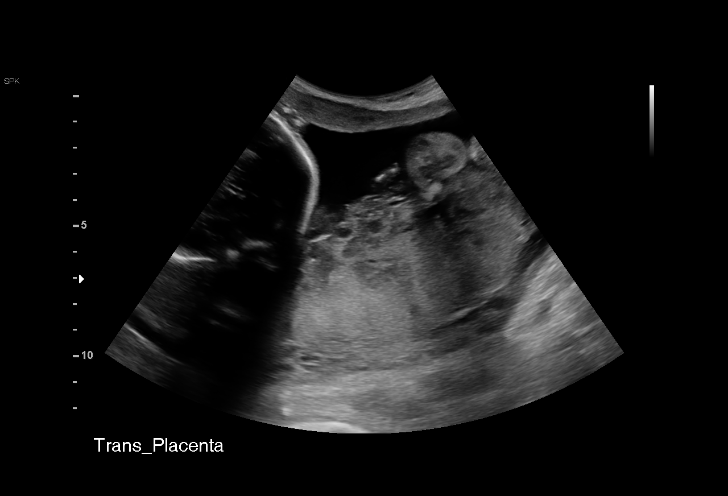
[im 17/90]
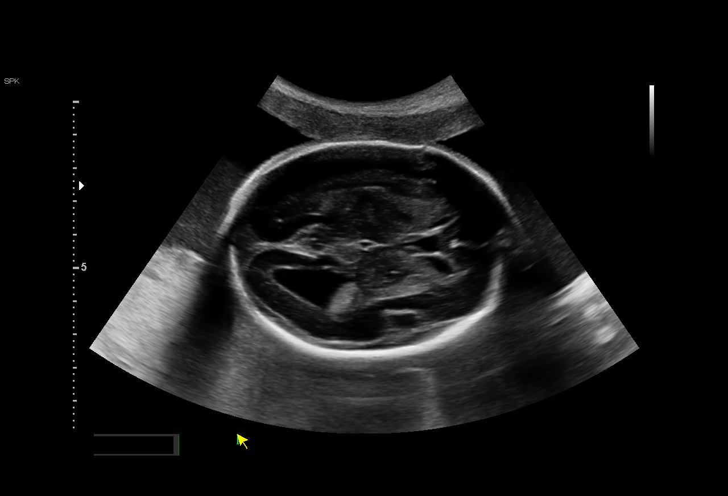
[im 24/90]
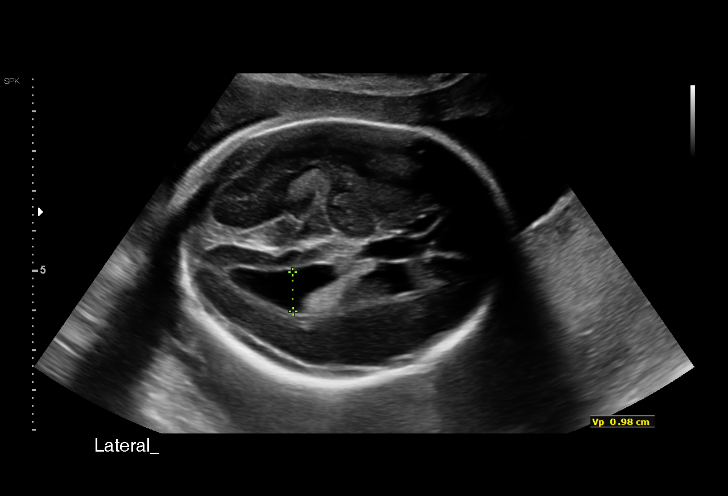
[im 30/90]
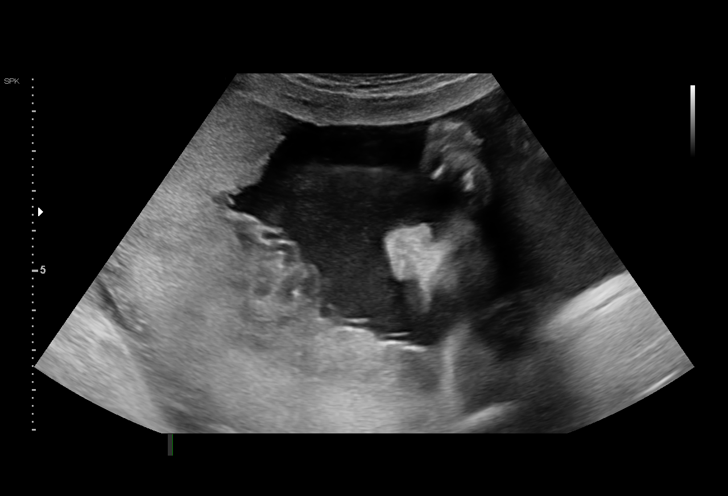
[im 37/90]
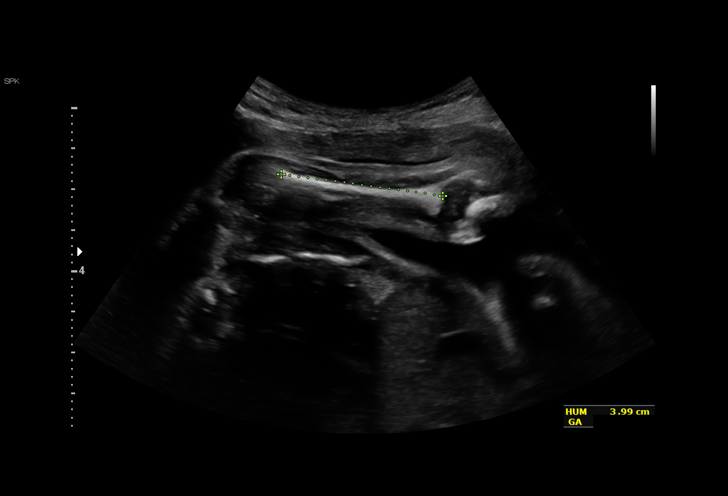
[im 47/90]
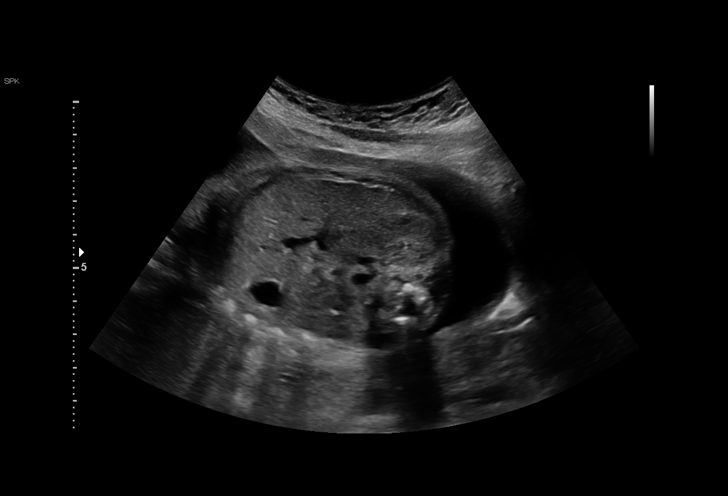
[im 53/90]
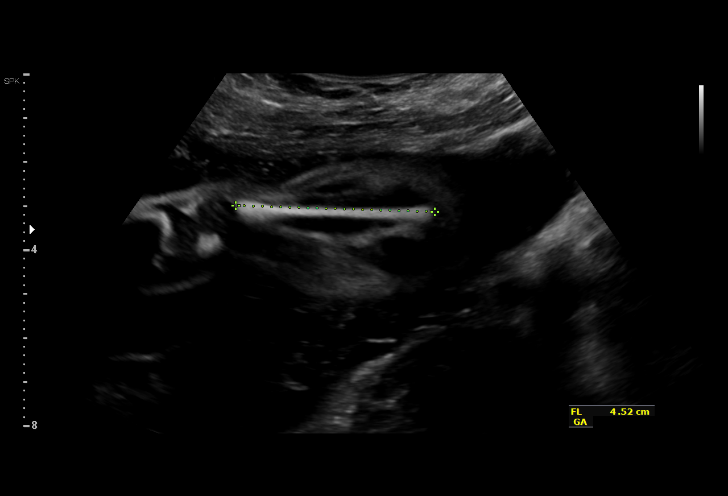
[im 60/90]
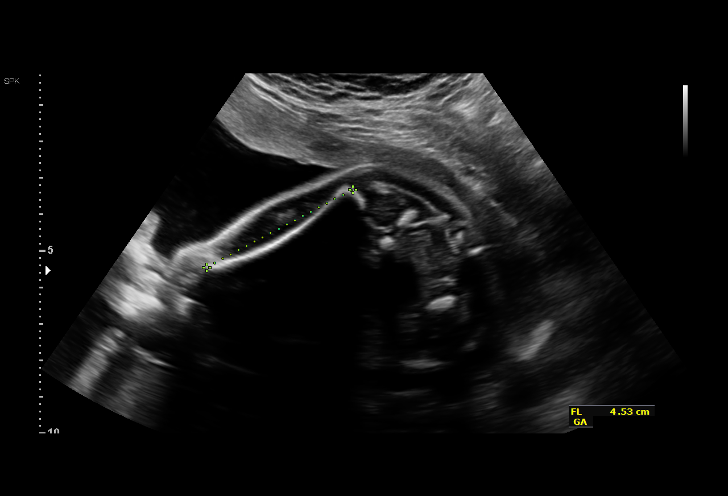
[im 66/90]
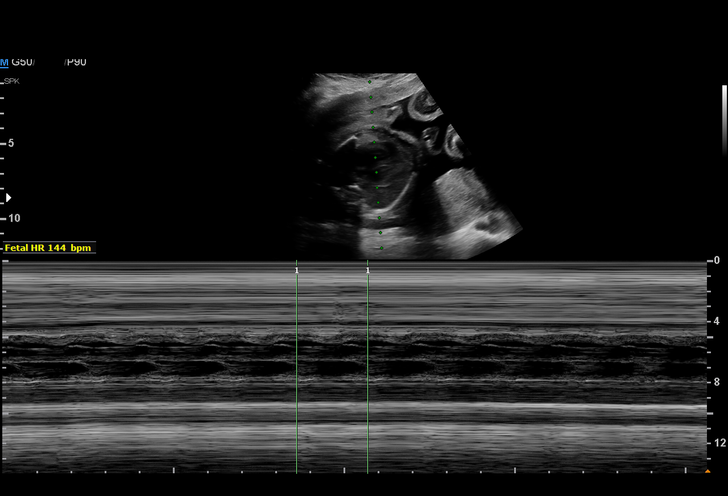
[im 73/90]
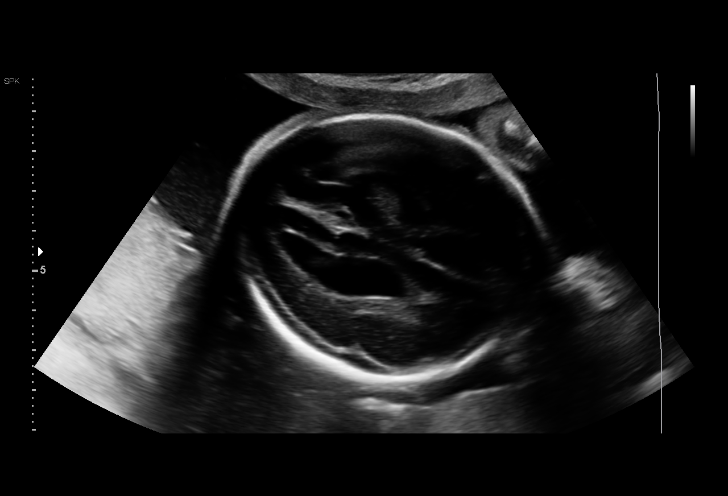
[im 80/90]
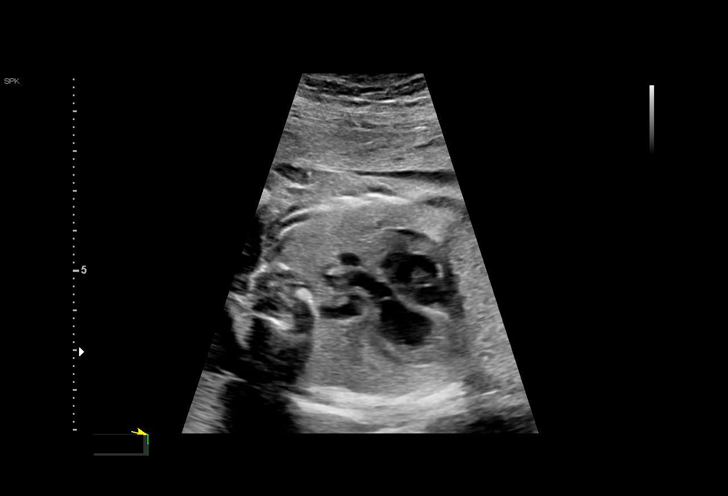
[im 86/90]
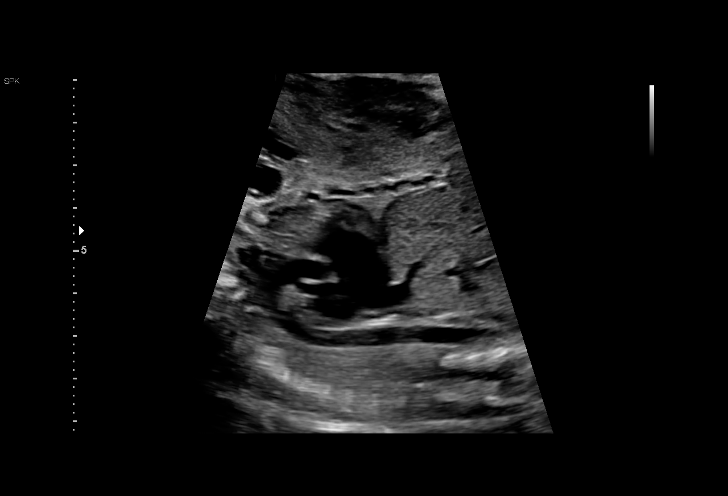

[13 of 28 positions shown; findings below may reference images not displayed]

am)

Indications

25 weeks gestation of pregnancy
Advanced maternal age multigravida 35+,
first trimester
Obesity complicating pregnancy, second
trimester
Encounter for antenatal screening,
unspecified
OB History

Blood Type:            Height:  5'5"   Weight (lb):  198       BMI:
Gravidity:    5         Term:   4        Prem:   0        SAB:   0
TOP:          0       Ectopic:  0        Living: 5
Fetal Evaluation

Num Of Fetuses:     1
Fetal Heart         144
Rate(bpm):
Cardiac Activity:   Observed
Presentation:       Breech
Placenta:           Posterior, above cervical os
P. Cord Insertion:  Visualized, central

Amniotic Fluid
AFI FV:      Subjectively within normal limits

Largest Pocket(cm)
3.46
Biometry

BPD:      63.2  mm     G. Age:  25w 4d         36  %    CI:        70.68   %    70 - 86
FL/HC:      19.0   %    18.6 -
HC:      239.6  mm     G. Age:  26w 0d         37  %    HC/AC:      1.10        1.04 -
AC:      216.9  mm     G. Age:  26w 1d         55  %    FL/BPD:     72.2   %    71 - 87
FL:       45.6  mm     G. Age:  25w 1d         20  %    FL/AC:      21.0   %    20 - 24
HUM:      40.6  mm     G. Age:  24w 5d         18  %
CER:      29.8  mm     G. Age:  26w 3d         63  %

Est. FW:     847  gm    1 lb 14 oz      54  %
Gestational Age

U/S Today:     25w 5d                                        EDD:   09/08/16
Best:          25w 5d     Det. By:  Early Ultrasound         EDD:   09/08/16
(02/07/16)
Anatomy

Cranium:               Appears normal         Aortic Arch:            Appears normal
Cavum:                 Appears normal         Ductal Arch:            Not well visualized
Ventricles:            Variant, see           Diaphragm:              Appears normal
comments
Choroid Plexus:        Dangling Choroid       Stomach:                Appears normal, left
sided
Cerebellum:            Appears normal         Abdomen:                Appears normal
Posterior Fossa:       Appears normal         Abdominal Wall:         Appears nml (cord
insert, abd wall)
Nuchal Fold:           Not applicable (>20    Cord Vessels:           Appears normal (3
wks GA)                                        vessel cord)
Face:                  Appears normal         Kidneys:                Appear normal
(orbits and profile)
Lips:                  Appears normal         Bladder:                Appears normal
Thoracic:              Appears normal         Spine:                  Limited views
appear normal
Heart:                 Appears normal         Upper Extremities:      Appears normal
(4CH, axis, and situs
RVOT:                  Appears normal         Lower Extremities:      Appears normal
LVOT:                  Appears normal

Other:  Fetus appears to be a female. Both heels and 5th digits noted
Technically difficult due to fetal position.
Cervix Uterus Adnexa

Cervix
Length:           3.26  cm.
Normal appearance by transabdominal scan.

Uterus
No abnormality visualized.

Left Ovary
Not visualized.

Right Ovary
Within normal limits.
Impression

Singleton intrauterine pregnancy at 25+5 weeks with AMA.
Here for anatomic survey
Review of the anatomy shows no sonographic markers for
aneuploidy or structural anomalies
However, spine evaluation should be considered suboptimal
secondary to fetal position
The lateral ventricl is at the upper limit of normal in the atrium
measurement and the choroid is somewhat dangling, but it is
technically normal
Amniotic fluid volume is normal
Estimated fetal weight is 847g which is growth in the 54th
percentile
Recommendations

Return for follow up exam in 4 weeks. The ventricles are
normal, but they are a bit worrisome. I want to see them
again in 4 weeks. I did not discuss these findings with the
patient as they are, as mentioned above, technically normal

## 2018-07-14 DIAGNOSIS — H16223 Keratoconjunctivitis sicca, not specified as Sjogren's, bilateral: Secondary | ICD-10-CM | POA: Diagnosis not present

## 2018-09-11 DIAGNOSIS — G44209 Tension-type headache, unspecified, not intractable: Secondary | ICD-10-CM | POA: Diagnosis not present

## 2018-12-24 DIAGNOSIS — H16223 Keratoconjunctivitis sicca, not specified as Sjogren's, bilateral: Secondary | ICD-10-CM | POA: Diagnosis not present

## 2019-01-18 DIAGNOSIS — H1013 Acute atopic conjunctivitis, bilateral: Secondary | ICD-10-CM | POA: Diagnosis not present

## 2019-02-16 DIAGNOSIS — S8991XA Unspecified injury of right lower leg, initial encounter: Secondary | ICD-10-CM | POA: Diagnosis not present

## 2019-02-18 DIAGNOSIS — M25561 Pain in right knee: Secondary | ICD-10-CM | POA: Diagnosis not present

## 2019-03-15 DIAGNOSIS — F319 Bipolar disorder, unspecified: Secondary | ICD-10-CM | POA: Diagnosis not present

## 2019-03-31 DIAGNOSIS — F439 Reaction to severe stress, unspecified: Secondary | ICD-10-CM | POA: Diagnosis not present

## 2019-03-31 DIAGNOSIS — F331 Major depressive disorder, recurrent, moderate: Secondary | ICD-10-CM | POA: Diagnosis not present

## 2019-06-21 DIAGNOSIS — F331 Major depressive disorder, recurrent, moderate: Secondary | ICD-10-CM | POA: Diagnosis not present

## 2019-06-21 DIAGNOSIS — F439 Reaction to severe stress, unspecified: Secondary | ICD-10-CM | POA: Diagnosis not present

## 2019-08-10 ENCOUNTER — Ambulatory Visit (INDEPENDENT_AMBULATORY_CARE_PROVIDER_SITE_OTHER): Payer: Medicaid Other

## 2019-08-10 ENCOUNTER — Other Ambulatory Visit: Payer: Self-pay

## 2019-08-10 DIAGNOSIS — Z3200 Encounter for pregnancy test, result unknown: Secondary | ICD-10-CM

## 2019-08-10 DIAGNOSIS — Z3202 Encounter for pregnancy test, result negative: Secondary | ICD-10-CM | POA: Diagnosis not present

## 2019-08-10 DIAGNOSIS — B9620 Unspecified Escherichia coli [E. coli] as the cause of diseases classified elsewhere: Secondary | ICD-10-CM

## 2019-08-10 DIAGNOSIS — N390 Urinary tract infection, site not specified: Secondary | ICD-10-CM

## 2019-08-10 DIAGNOSIS — R3989 Other symptoms and signs involving the genitourinary system: Secondary | ICD-10-CM

## 2019-08-10 DIAGNOSIS — N949 Unspecified condition associated with female genital organs and menstrual cycle: Secondary | ICD-10-CM | POA: Diagnosis not present

## 2019-08-10 DIAGNOSIS — R350 Frequency of micturition: Secondary | ICD-10-CM | POA: Diagnosis not present

## 2019-08-10 LAB — POCT URINALYSIS DIP (DEVICE)
Bilirubin Urine: NEGATIVE
Glucose, UA: NEGATIVE mg/dL
Ketones, ur: NEGATIVE mg/dL
Nitrite: NEGATIVE
Protein, ur: 100 mg/dL — AB
Specific Gravity, Urine: >=1.03 (ref 1.005–1.030)
Urobilinogen, UA: 0.2 mg/dL (ref 0.0–1.0)
pH: 6.5 (ref 5.0–8.0)

## 2019-08-10 LAB — POCT PREGNANCY, URINE: Preg Test, Ur: NEGATIVE

## 2019-08-10 MED ORDER — NITROFURANTOIN MONOHYD MACRO 100 MG PO CAPS: 100.0000 mg | ORAL_CAPSULE | Freq: Two times a day (BID) | ORAL | 0 refills | Status: DC

## 2019-08-10 MED ORDER — LIDOCAINE HCL 2 % EX GEL: 1.0000 "application " | CUTANEOUS | 0 refills | Status: DC | PRN

## 2019-08-10 NOTE — Progress Notes (Signed)
Here on 08/10/19 with urinary frequency. Pt reports feeling the need to urinate every 5 minutes. Pt states "I feel like I am having an orgasm every time I pee." Symptoms began approx. 1 week ago at the start of menstrual period. UA is suspicious for UTI. Macrobid ordered per protocol. Reviewed with Sparacino, DO who gives verbal order for lidocaine jelly to be applied topically until completion of antibiotics.   Pt requests UPT. States she is using Mirena IUD for contraception. I explained it is very unlikely pt is pregnant, but we can perform a UPT if pt desires. UPT today is negative.   Pt scheduled for annual visit with provider to update PAP, etc. Explained we will call with any abnormal results that require a different antibiotic. Encouraged pt to call the office if symptoms do not resolve following antibiotic treatment.  Fleet Contras RN 08/12/19

## 2019-08-11 DIAGNOSIS — H1013 Acute atopic conjunctivitis, bilateral: Secondary | ICD-10-CM | POA: Diagnosis not present

## 2019-08-12 ENCOUNTER — Telehealth: Payer: Self-pay

## 2019-08-12 DIAGNOSIS — Z419 Encounter for procedure for purposes other than remedying health state, unspecified: Secondary | ICD-10-CM | POA: Diagnosis not present

## 2019-08-12 LAB — URINE CULTURE

## 2019-08-12 NOTE — Telephone Encounter (Addendum)
-----   Message from Marlowe Alt, DO sent at 08/12/2019  2:40 PM EDT ----- Patient U Cx +E. Coli, already on Macrobid  L/M that she has a UTI and that the current medication that she was prescribed is effective for tx.  If she has any questions to please give [place] a call back.    Addison Naegeli RN

## 2019-08-17 NOTE — Progress Notes (Signed)
Chart reviewed for nurse visit. Agree with plan of care.   Shruthi Northrup L, DO 08/17/2019 9:07 AM

## 2019-09-12 DIAGNOSIS — Z419 Encounter for procedure for purposes other than remedying health state, unspecified: Secondary | ICD-10-CM | POA: Diagnosis not present

## 2019-09-23 ENCOUNTER — Ambulatory Visit: Payer: Medicaid Other | Admitting: Obstetrics and Gynecology

## 2019-09-23 ENCOUNTER — Telehealth: Payer: Self-pay | Admitting: General Practice

## 2019-09-23 NOTE — Telephone Encounter (Signed)
Patient no showed for annual exam appt today. Called patient, no answer- left message stating we were calling regarding her missed appt today and to get her rescheduled. Please call us back.

## 2019-11-12 DIAGNOSIS — Z419 Encounter for procedure for purposes other than remedying health state, unspecified: Secondary | ICD-10-CM | POA: Diagnosis not present

## 2019-12-13 DIAGNOSIS — Z419 Encounter for procedure for purposes other than remedying health state, unspecified: Secondary | ICD-10-CM | POA: Diagnosis not present

## 2020-01-12 DIAGNOSIS — Z419 Encounter for procedure for purposes other than remedying health state, unspecified: Secondary | ICD-10-CM | POA: Diagnosis not present

## 2020-02-12 DIAGNOSIS — Z419 Encounter for procedure for purposes other than remedying health state, unspecified: Secondary | ICD-10-CM | POA: Diagnosis not present

## 2020-03-14 DIAGNOSIS — Z419 Encounter for procedure for purposes other than remedying health state, unspecified: Secondary | ICD-10-CM | POA: Diagnosis not present

## 2020-04-11 DIAGNOSIS — Z419 Encounter for procedure for purposes other than remedying health state, unspecified: Secondary | ICD-10-CM | POA: Diagnosis not present

## 2020-05-12 DIAGNOSIS — Z419 Encounter for procedure for purposes other than remedying health state, unspecified: Secondary | ICD-10-CM | POA: Diagnosis not present

## 2020-06-04 DIAGNOSIS — H5213 Myopia, bilateral: Secondary | ICD-10-CM | POA: Diagnosis not present

## 2020-06-11 DIAGNOSIS — Z419 Encounter for procedure for purposes other than remedying health state, unspecified: Secondary | ICD-10-CM | POA: Diagnosis not present

## 2020-07-12 DIAGNOSIS — Z419 Encounter for procedure for purposes other than remedying health state, unspecified: Secondary | ICD-10-CM | POA: Diagnosis not present

## 2020-08-11 DIAGNOSIS — Z419 Encounter for procedure for purposes other than remedying health state, unspecified: Secondary | ICD-10-CM | POA: Diagnosis not present

## 2020-09-11 DIAGNOSIS — Z419 Encounter for procedure for purposes other than remedying health state, unspecified: Secondary | ICD-10-CM | POA: Diagnosis not present

## 2020-10-12 DIAGNOSIS — Z419 Encounter for procedure for purposes other than remedying health state, unspecified: Secondary | ICD-10-CM | POA: Diagnosis not present

## 2020-11-11 DIAGNOSIS — Z419 Encounter for procedure for purposes other than remedying health state, unspecified: Secondary | ICD-10-CM | POA: Diagnosis not present

## 2020-12-12 DIAGNOSIS — Z419 Encounter for procedure for purposes other than remedying health state, unspecified: Secondary | ICD-10-CM | POA: Diagnosis not present

## 2021-01-11 DIAGNOSIS — Z419 Encounter for procedure for purposes other than remedying health state, unspecified: Secondary | ICD-10-CM | POA: Diagnosis not present

## 2021-02-11 DIAGNOSIS — Z419 Encounter for procedure for purposes other than remedying health state, unspecified: Secondary | ICD-10-CM | POA: Diagnosis not present

## 2021-03-14 DIAGNOSIS — Z419 Encounter for procedure for purposes other than remedying health state, unspecified: Secondary | ICD-10-CM | POA: Diagnosis not present

## 2021-04-11 DIAGNOSIS — Z419 Encounter for procedure for purposes other than remedying health state, unspecified: Secondary | ICD-10-CM | POA: Diagnosis not present

## 2021-05-12 DIAGNOSIS — Z419 Encounter for procedure for purposes other than remedying health state, unspecified: Secondary | ICD-10-CM | POA: Diagnosis not present

## 2021-06-11 DIAGNOSIS — Z419 Encounter for procedure for purposes other than remedying health state, unspecified: Secondary | ICD-10-CM | POA: Diagnosis not present

## 2021-07-12 DIAGNOSIS — Z419 Encounter for procedure for purposes other than remedying health state, unspecified: Secondary | ICD-10-CM | POA: Diagnosis not present

## 2021-08-11 DIAGNOSIS — Z419 Encounter for procedure for purposes other than remedying health state, unspecified: Secondary | ICD-10-CM | POA: Diagnosis not present

## 2021-09-11 DIAGNOSIS — Z419 Encounter for procedure for purposes other than remedying health state, unspecified: Secondary | ICD-10-CM | POA: Diagnosis not present

## 2021-10-12 DIAGNOSIS — Z419 Encounter for procedure for purposes other than remedying health state, unspecified: Secondary | ICD-10-CM | POA: Diagnosis not present

## 2021-11-11 DIAGNOSIS — Z419 Encounter for procedure for purposes other than remedying health state, unspecified: Secondary | ICD-10-CM | POA: Diagnosis not present

## 2021-12-12 DIAGNOSIS — Z419 Encounter for procedure for purposes other than remedying health state, unspecified: Secondary | ICD-10-CM | POA: Diagnosis not present

## 2022-01-11 DIAGNOSIS — Z419 Encounter for procedure for purposes other than remedying health state, unspecified: Secondary | ICD-10-CM | POA: Diagnosis not present

## 2022-02-11 DIAGNOSIS — Z419 Encounter for procedure for purposes other than remedying health state, unspecified: Secondary | ICD-10-CM | POA: Diagnosis not present

## 2022-03-14 DIAGNOSIS — Z419 Encounter for procedure for purposes other than remedying health state, unspecified: Secondary | ICD-10-CM | POA: Diagnosis not present

## 2022-04-12 DIAGNOSIS — Z419 Encounter for procedure for purposes other than remedying health state, unspecified: Secondary | ICD-10-CM | POA: Diagnosis not present

## 2022-05-13 DIAGNOSIS — Z419 Encounter for procedure for purposes other than remedying health state, unspecified: Secondary | ICD-10-CM | POA: Diagnosis not present

## 2022-05-22 ENCOUNTER — Ambulatory Visit (INDEPENDENT_AMBULATORY_CARE_PROVIDER_SITE_OTHER): Payer: Medicaid Other | Admitting: Obstetrics and Gynecology

## 2022-05-22 ENCOUNTER — Other Ambulatory Visit (HOSPITAL_COMMUNITY)
Admission: RE | Admit: 2022-05-22 | Discharge: 2022-05-22 | Disposition: A | Discharge status: Home / Self Care | Payer: Medicaid Other | Source: Ambulatory Visit | Attending: Obstetrics and Gynecology | Admitting: Obstetrics and Gynecology

## 2022-05-22 ENCOUNTER — Other Ambulatory Visit: Payer: Self-pay

## 2022-05-22 ENCOUNTER — Encounter: Payer: Self-pay | Admitting: Obstetrics and Gynecology

## 2022-05-22 VITALS — BP 122/65 | HR 68 | Ht 65.0 in | Wt 164.9 lb

## 2022-05-22 DIAGNOSIS — Z124 Encounter for screening for malignant neoplasm of cervix: Secondary | ICD-10-CM

## 2022-05-22 DIAGNOSIS — Z3042 Encounter for surveillance of injectable contraceptive: Secondary | ICD-10-CM

## 2022-05-22 DIAGNOSIS — Z01419 Encounter for gynecological examination (general) (routine) without abnormal findings: Secondary | ICD-10-CM | POA: Diagnosis not present

## 2022-05-22 LAB — POCT PREGNANCY, URINE: Preg Test, Ur: NEGATIVE

## 2022-05-22 MED ORDER — MEDROXYPROGESTERONE ACETATE 150 MG/ML IM SUSP
150.0000 mg | Freq: Once | INTRAMUSCULAR | Status: AC
  Administered 2022-05-22: 150 mg via INTRAMUSCULAR

## 2022-05-22 NOTE — Progress Notes (Signed)
ANNUAL EXAM Patient name: Anna James MRN [id number]  Date of birth: 1980-07-03 Chief Complaint:   Gynecologic Exam  History of Present Illness:   Anna James is a 42 y.o. [id number] being seen today for a routine annual exam.  Current complaints: contraception   Menstrual concerns? No   Breast or nipple changes? No  Contraception use? No nothing currently, doesn't want pills and has previously had IUD (removed a few years ago). Mirena was good - first time, no periods; 2nd one was having regular periods and removed as it was due for removal  Interested in depo injection and would like to see how it goes  Sexually active? Yes   Patient's last menstrual period was 05/05/2022.   Upstream - 05/22/22 1403       Pregnancy Intention Screening   Does the patient want to become pregnant in the next year? No    Does the patient's partner want to become pregnant in the next year? Yes    Would the patient like to discuss contraceptive options today? Yes      Contraception Wrap Up   Current Method No Contraceptive Precautions            The pregnancy intention screening data noted above was reviewed. Potential methods of contraception were discussed. The patient elected to proceed with No data recorded.   Last pap     Component Value Date/Time   DIAGPAP  12/18/2016 0000    NEGATIVE FOR INTRAEPITHELIAL LESIONS OR MALIGNANCY.   DIAGPAP TRICHOMONAS VAGINALIS PRESENT. 12/18/2016 0000   ADEQPAP  12/18/2016 0000    Satisfactory for evaluation  endocervical/transformation zone component PRESENT.    Last mammogram: has had it done but none abnormal  Last colonoscopy: n/a     05/22/2022    2:01 PM 09/30/2016    2:59 PM 08/22/2016    2:10 PM 08/05/2016    2:54 PM 05/31/2016    8:25 AM  Depression screen PHQ 2/9  Decreased Interest 0 0 0 0 1  Down, Depressed, Hopeless 0 0 0 0 0  PHQ - 2 Score 0 0 0 0 1  Altered sleeping 0 0 0 0 0  Tired, decreased energy 0 0 1 0 1  Change in  appetite 0 0 1 0 1  Feeling bad or failure about yourself  0 0 0 0 0  Trouble concentrating 0 0 0 0 0  Moving slowly or fidgety/restless  0 0 0 0  Suicidal thoughts  0 0 0 0  PHQ-9 Score 0 0 2 0 3        09/30/2016    2:59 PM 08/22/2016    2:11 PM 08/05/2016    2:54 PM 05/31/2016    8:26 AM  GAD 7 : Generalized Anxiety Score  Nervous, Anxious, on Edge 0 0 0 0  Control/stop worrying 0 0 0 0  Worry too much - different things 0 0 0 0  Trouble relaxing 0 0 0 0  Restless 0 0 0 0  Easily annoyed or irritable 0 1 1 0  Afraid - awful might happen 0 0 0 0  Total GAD 7 Score 0 1 1 0     Review of Systems:   Pertinent items are noted in HPI Denies any headaches, blurred vision, fatigue, shortness of breath, chest pain, abdominal pain, abnormal vaginal discharge/itching/odor/irritation, problems with periods, bowel movements, urination, or intercourse unless otherwise stated above. Pertinent History Reviewed:  Reviewed past medical,surgical, social and family  history.  Reviewed problem list, medications and allergies. Physical Assessment:   Vitals:   05/22/22 1400  BP: 122/65  Pulse: 68  Weight: 164 lb 14.4 oz (74.8 kg)  Height: 5\' 5"  (1.651 m)  Body mass index is 27.44 kg/m.        Physical Examination:   General appearance - well appearing, and in no distress  Mental status - alert, oriented to person, place, and time  Psych:  She has a normal mood and affect  Skin - warm and dry, normal color, no suspicious lesions noted  Chest - effort normal, all lung fields clear to auscultation bilaterally  Heart - normal rate and regular rhythm  Breasts - breasts appear normal, no suspicious masses, no skin or nipple changes or  axillary nodes  Abdomen - soft, nontender, nondistended, no masses or organomegaly  Pelvic -  VULVA: normal appearing vulva with no masses, tenderness or lesions   VAGINA: normal appearing vagina with normal color and discharge, no lesions   CERVIX: normal  appearing cervix without discharge or lesions, no CMT  Thin prep pap is done with HR HPV cotesting  UTERUS: uterus is felt to be normal size, shape, consistency and nontender   ADNEXA: No adnexal masses or tenderness noted.  Extremities:  No swelling or varicosities noted  Chaperone present for exam  No results found for this or any previous visit (from the past 24 hour(s)).    Assessment & Plan:  1. Well woman exam with routine gynecological exam - Cervical cancer screening: Discussed guidelines. Pap with HPV collected - STD Testing: accepts - Birth Control: Discussed options and their risks, benefits and common side effects; discussed VTE with estrogen containing options. Desires: Depo - Breast Health: Encouraged self breast awareness/SBE. Teaching provided. Discussed limits of clinical breast exam for detecting breast cancer.  Ordered/scheduled - F/U 12 months and prn  - RPR+HBsAg+HCVAb+... - GC/Chlam[facility] - Cytology - PAP( [place]) - MM 3D SCREENING MAMMOGRAM BILATERAL BREAST; Future  2. Screening for cervical cancer Pap collected - Cytology - PAP( [place])  3. Encounter for Depo-Provera contraception Initiate depo today and observe menstrual pattern - medroxyPROGESTERone (DEPO-PROVERA) injection 150 mg   Orders Placed This Encounter  Procedures   MM 3D SCREENING MAMMOGRAM BILATERAL BREAST   RPR+HBsAg+HCVAb+...   Pregnancy, urine POC    Meds:  Meds ordered this encounter  Medications   medroxyPROGESTERone (DEPO-PROVERA) injection 150 mg    Follow-up: Return in about 3 months (around 08/21/2022).  Lorriane Shirehristana Kaipo Ardis, MD 05/22/2022 2:05 PM

## 2022-05-23 LAB — GC/CHLAMYDIA PROBE AMP (~~LOC~~) NOT AT ARMC
Chlamydia: NEGATIVE
Comment: NEGATIVE
Comment: NORMAL
Neisseria Gonorrhea: NEGATIVE

## 2022-05-23 LAB — RPR+HBSAG+HCVAB+...
HIV Screen 4th Generation wRfx: NONREACTIVE
Hep C Virus Ab: NONREACTIVE
Hepatitis B Surface Ag: NEGATIVE
RPR Ser Ql: NONREACTIVE

## 2022-05-27 ENCOUNTER — Telehealth: Payer: Self-pay | Admitting: Lactation Services

## 2022-05-27 LAB — CYTOLOGY - PAP
Comment: NEGATIVE
Comment: NEGATIVE
Diagnosis: NEGATIVE
High risk HPV: NEGATIVE
Trichomonas: POSITIVE — AB

## 2022-05-27 NOTE — Telephone Encounter (Signed)
Called patient with results of Vaginal Swab and blood work for STI testing. She did not answer. LM for her to call the office at [phone] at her convenience for non urgent results.

## 2022-05-27 NOTE — Telephone Encounter (Signed)
-----   Message from Lorriane Shire, MD sent at 05/23/2022  4:44 PM EDT ----- Negative STI vaginal swab

## 2022-05-28 ENCOUNTER — Other Ambulatory Visit: Payer: Self-pay | Admitting: Obstetrics and Gynecology

## 2022-05-28 DIAGNOSIS — A599 Trichomoniasis, unspecified: Secondary | ICD-10-CM

## 2022-05-28 MED ORDER — METRONIDAZOLE 500 MG PO TABS: 500.0000 mg | ORAL_TABLET | Freq: Two times a day (BID) | ORAL | 0 refills | Status: AC

## 2022-05-28 NOTE — Telephone Encounter (Signed)
Attempted to call patient with results of Pap Smear showing it is + for Trich. All other STI testing is negative. Patient did not answer. LM for her to call the office for results. [facility].

## 2022-05-29 NOTE — Telephone Encounter (Signed)
Called pt; VM left. MyChart not active. Letter sent.

## 2022-06-12 DIAGNOSIS — Z419 Encounter for procedure for purposes other than remedying health state, unspecified: Secondary | ICD-10-CM | POA: Diagnosis not present

## 2022-06-17 ENCOUNTER — Other Ambulatory Visit: Payer: Self-pay

## 2022-06-17 MED ORDER — METRONIDAZOLE 500 MG PO TABS: 500.0000 mg | ORAL_TABLET | Freq: Two times a day (BID) | ORAL | 0 refills | Status: DC

## 2022-07-01 ENCOUNTER — Ambulatory Visit: Payer: Medicaid Other

## 2022-07-13 DIAGNOSIS — Z419 Encounter for procedure for purposes other than remedying health state, unspecified: Secondary | ICD-10-CM | POA: Diagnosis not present

## 2022-08-12 DIAGNOSIS — Z419 Encounter for procedure for purposes other than remedying health state, unspecified: Secondary | ICD-10-CM | POA: Diagnosis not present

## 2022-09-12 DIAGNOSIS — Z419 Encounter for procedure for purposes other than remedying health state, unspecified: Secondary | ICD-10-CM | POA: Diagnosis not present

## 2022-10-04 ENCOUNTER — Encounter (HOSPITAL_COMMUNITY): Payer: Self-pay

## 2022-10-04 ENCOUNTER — Ambulatory Visit (HOSPITAL_COMMUNITY)
Admission: EM | Admit: 2022-10-04 | Discharge: 2022-10-04 | Disposition: A | Discharge status: Home / Self Care | Payer: Medicaid Other | Source: Home / Self Care

## 2022-10-04 ENCOUNTER — Ambulatory Visit (INDEPENDENT_AMBULATORY_CARE_PROVIDER_SITE_OTHER): Payer: Medicaid Other

## 2022-10-04 DIAGNOSIS — S99922A Unspecified injury of left foot, initial encounter: Secondary | ICD-10-CM

## 2022-10-04 DIAGNOSIS — M19072 Primary osteoarthritis, left ankle and foot: Secondary | ICD-10-CM | POA: Diagnosis not present

## 2022-10-04 DIAGNOSIS — M2012 Hallux valgus (acquired), left foot: Secondary | ICD-10-CM | POA: Diagnosis not present

## 2022-10-04 DIAGNOSIS — M7989 Other specified soft tissue disorders: Secondary | ICD-10-CM | POA: Diagnosis not present

## 2022-10-04 MED ORDER — DICLOFENAC SODIUM 1 % EX GEL: 2.0000 g | Freq: Four times a day (QID) | CUTANEOUS | 2 refills | Status: DC

## 2022-10-04 NOTE — Discharge Instructions (Addendum)
Rest - try to avoid heavy lifting and high impact activity Ice - apply for 20 minutes a few times daily Compression - use ace wrap when walking/standing  Elevation - prop up on a pillow  Voltaren gel can be used up to 4 times daily

## 2022-10-04 NOTE — ED Provider Notes (Signed)
[facility]    CSN: [id number] Arrival date & time: 10/04/22  7846      History   Chief Complaint Chief Complaint  Patient presents with   Foot Injury    HPI Anna James is a 42 y.o. female.  Left foot injury occurred 3 days ago Walking down the stairs, twisted ankle and heard crack in the foot Pain with weight bearing. Mostly along lateral foot. There was some bruising at first that improved.  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   H/O cesarean section 05/11/2015    Past Surgical History:  Procedure Laterality Date   CESAREAN SECTION     CESAREAN SECTION N/A 05/11/2015   Procedure: CESAREAN SECTION;  Surgeon: Lavina Hamman, MD;  Location: [facility];  Service: Obstetrics;  Laterality: N/A;   CESAREAN SECTION N/A 09/01/2016   Procedure: REPEAT CESAREAN SECTION;  Surgeon: Willodean Rosenthal, MD;  Location: [facility];  Service: Obstetrics;  Laterality: N/A;    OB History     Gravida  5   Para  5   Term  5   Preterm      AB      Living  6      SAB      IAB      Ectopic      Multiple  1   Live Births  6            Home Medications    Prior to Admission medications   Medication Sig Start Date End Date Taking? Authorizing Provider  diclofenac Sodium (VOLTAREN ARTHRITIS PAIN) 1 % GEL Apply 2 g topically 4 (four) times daily. 10/04/22  Yes [place] Perra, Ray Church    Family History History reviewed. No pertinent family history.  Social History Social History   Tobacco Use   Smoking status: Never   Smokeless tobacco: Never  Substance Use Topics   Alcohol use: No   Drug use: No     Allergies   Other   Review of Systems Review of Systems As per HPI  Physical Exam Triage Vital Signs ED  Triage Vitals  Encounter Vitals Group     BP 10/04/22 1133 121/75     Systolic BP Percentile --      Diastolic BP Percentile --      Pulse Rate 10/04/22 1133 75     Resp 10/04/22 1133 16     Temp 10/04/22 1133 (!) 97.5 F (36.4 C)     Temp Source 10/04/22 1133 Oral     SpO2 10/04/22 1133 98 %     Weight 10/04/22 1133 165 lb (74.8 kg)     Height 10/04/22 1133 5\' 5"  (1.651 m)     Head Circumference --      Peak Flow --      Pain Score 10/04/22 1132 7     Pain Loc --      Pain Education --      Exclude from Growth Chart --    No data found.  Updated Vital Signs BP 121/75 (BP Location: Left Arm)   Pulse 75   Temp (!) 97.5 F (36.4 C) (Oral)   Resp 16   Ht  5\' 5"  (1.651 m)   Wt 165 lb (74.8 kg)   LMP 08/28/2022 (Approximate)   SpO2 98%   BMI 27.46 kg/m   Physical Exam Vitals and nursing note reviewed.  Constitutional:      General: She is not in acute distress. HENT:     Mouth/Throat:     Pharynx: Oropharynx is clear.  Cardiovascular:     Rate and Rhythm: Normal rate and regular rhythm.     Pulses: Normal pulses.  Pulmonary:     Effort: Pulmonary effort is normal.  Musculoskeletal:        General: Tenderness present.     Cervical back: Normal range of motion.     Comments: Tender over lateral left foot. There is minimal tenderness over ATFL. No ankle bony tenderness, no midfoot or toe tenderness. Good ROM at the ankle. Distal sensation intact. Strong DP pulse. Cap refill < 2 seconds  Skin:    Capillary Refill: Capillary refill takes less than 2 seconds.  Neurological:     Mental Status: She is alert and oriented to person, place, and time.     UC Treatments / Results  Labs (all labs ordered are listed, but only abnormal results are displayed) Labs Reviewed - No data to display  EKG  Radiology DG Foot Complete Left  Result Date: 10/04/2022 CLINICAL DATA:  Left foot injury while walking downstairs 3 days ago. Twisting injury. Heard and felt a crack. EXAM:  LEFT FOOT - COMPLETE 3+ VIEW COMPARISON:  None Available. FINDINGS: Mild hallux valgus. Mild minimal medial great toe metatarsophalangeal peripheral degenerative spurring without significant joint space narrowing. Mild chronic enthesopathic change at the Achilles insertion on the calcaneus. Moderate medial navicular and adjacent medial cuneiform joint space narrowing, subchondral sclerosis, and apparent subchondral cystic change within the navicular. Mild dorsal naviculocuneiform and tarsometatarsal degenerative osteophytosis on lateral view. No acute fracture is seen.  No dislocation. Mild diffuse forefoot soft tissue swelling. IMPRESSION: 1. No acute fracture. 2. Mild hallux valgus. 3. Moderate medial navicular-medial cuneiform osteoarthritis. 4. Mild diffuse forefoot soft tissue swelling. Electronically Signed   By: Neita Garnet M.D.   On: 10/04/2022 12:35    Procedures Procedures  Medications Ordered in UC Medications - No data to display  Initial Impression / Assessment and Plan / UC Course  I have reviewed the triage vital signs and the nursing notes.  Pertinent labs & imaging results that were available during my care of the patient were reviewed by me and considered in my medical decision making (see chart for details).  No acute fracture on x-ray.  There is moderate osteoarthritis at navicular bone. Discussed findings with patient. Applied Ace wrap in clinic for support.  Patient prefers topical over oral pain medicines, recommend Voltaren gel 4 times daily.  Advised ice and elevate.  Work note provided.  Can return or follow-up with Ortho if needed.  Patient agreeable to plan, no questions at this time.  Final Clinical Impressions(s) / UC Diagnoses   Final diagnoses:  Injury of left foot, initial encounter  Osteoarthritis of left foot, unspecified osteoarthritis type     Discharge Instructions      Rest - try to avoid heavy lifting and high impact activity Ice - apply for 20  minutes a few times daily Compression - use ace wrap when walking/standing  Elevation - prop up on a pillow  Voltaren gel can be used up to 4 times daily     ED Prescriptions     Medication  Sig Dispense Auth. Provider   diclofenac Sodium (VOLTAREN ARTHRITIS PAIN) 1 % GEL Apply 2 g topically 4 (four) times daily. 100 g Aundre Hietala, Lurena Joiner, PA-C      PDMP not reviewed this encounter.   Marlow Baars, [place] 10/04/22 1331

## 2022-10-04 NOTE — ED Triage Notes (Signed)
Patient here today with c/o a left foot injury while walking down stairs on Tuesday. She states that it twisted and she heard and felt a crack. She has been taking BC powder with some improvement. Increased pain weightbearing.

## 2022-10-13 DIAGNOSIS — Z419 Encounter for procedure for purposes other than remedying health state, unspecified: Secondary | ICD-10-CM | POA: Diagnosis not present

## 2022-11-12 DIAGNOSIS — Z419 Encounter for procedure for purposes other than remedying health state, unspecified: Secondary | ICD-10-CM | POA: Diagnosis not present

## 2022-12-13 DIAGNOSIS — Z419 Encounter for procedure for purposes other than remedying health state, unspecified: Secondary | ICD-10-CM | POA: Diagnosis not present

## 2023-01-12 DIAGNOSIS — Z419 Encounter for procedure for purposes other than remedying health state, unspecified: Secondary | ICD-10-CM | POA: Diagnosis not present

## 2023-02-12 DIAGNOSIS — Z419 Encounter for procedure for purposes other than remedying health state, unspecified: Secondary | ICD-10-CM | POA: Diagnosis not present

## 2023-02-18 ENCOUNTER — Ambulatory Visit: Payer: Medicaid Other

## 2023-02-18 DIAGNOSIS — Z3202 Encounter for pregnancy test, result negative: Secondary | ICD-10-CM | POA: Diagnosis not present

## 2023-02-18 DIAGNOSIS — Z3200 Encounter for pregnancy test, result unknown: Secondary | ICD-10-CM

## 2023-02-18 LAB — POCT PREGNANCY, URINE: Preg Test, Ur: NEGATIVE

## 2023-02-18 NOTE — Progress Notes (Signed)
 Possible Pregnancy  Patient dropped off urine today for pregnancy confirmation. UPT in office today is negative. Patient denies any at home positive pregnancy test. Patient reports LMP on 11/8 and feels pregnant. Patient reports symptoms of being sleepy and feeling hungry. Patient denies vaginal bleeding or abdominal pain.  Advised patient to return to our office in 1 week for UPT and further workup if needed. Patient agreed to plan.   Scheduled patient for an appointment on 1/15 at 9:00 AM. Patient confirmed scheduled appointment.    Rosaline Pendleton, RN 02/18/2023  3:40 PM

## 2023-02-26 ENCOUNTER — Ambulatory Visit: Payer: Medicaid Other

## 2023-03-11 NOTE — Addendum Note (Signed)
Addended byQuintella Reichert on: 03/11/2023 08:55 AM   Modules accepted: Level of Service

## 2023-03-15 DIAGNOSIS — Z419 Encounter for procedure for purposes other than remedying health state, unspecified: Secondary | ICD-10-CM | POA: Diagnosis not present

## 2023-04-12 DIAGNOSIS — Z419 Encounter for procedure for purposes other than remedying health state, unspecified: Secondary | ICD-10-CM | POA: Diagnosis not present

## 2023-05-24 DIAGNOSIS — Z419 Encounter for procedure for purposes other than remedying health state, unspecified: Secondary | ICD-10-CM | POA: Diagnosis not present

## 2023-06-10 DIAGNOSIS — K5900 Constipation, unspecified: Secondary | ICD-10-CM | POA: Diagnosis not present

## 2023-06-10 DIAGNOSIS — N912 Amenorrhea, unspecified: Secondary | ICD-10-CM | POA: Diagnosis not present

## 2023-06-23 DIAGNOSIS — Z419 Encounter for procedure for purposes other than remedying health state, unspecified: Secondary | ICD-10-CM | POA: Diagnosis not present

## 2023-07-24 DIAGNOSIS — Z419 Encounter for procedure for purposes other than remedying health state, unspecified: Secondary | ICD-10-CM | POA: Diagnosis not present

## 2023-08-23 DIAGNOSIS — Z419 Encounter for procedure for purposes other than remedying health state, unspecified: Secondary | ICD-10-CM | POA: Diagnosis not present

## 2023-09-23 DIAGNOSIS — Z419 Encounter for procedure for purposes other than remedying health state, unspecified: Secondary | ICD-10-CM | POA: Diagnosis not present

## 2023-10-24 DIAGNOSIS — Z419 Encounter for procedure for purposes other than remedying health state, unspecified: Secondary | ICD-10-CM | POA: Diagnosis not present

## 2023-11-23 DIAGNOSIS — Z419 Encounter for procedure for purposes other than remedying health state, unspecified: Secondary | ICD-10-CM | POA: Diagnosis not present

## 2024-01-09 DIAGNOSIS — H5213 Myopia, bilateral: Secondary | ICD-10-CM | POA: Diagnosis not present

## 2024-01-23 DIAGNOSIS — Z419 Encounter for procedure for purposes other than remedying health state, unspecified: Secondary | ICD-10-CM | POA: Diagnosis not present
